# Patient Record
Sex: Female | Born: 1960 | Race: White | Hispanic: No | Marital: Married | Smoking: Never smoker
Health system: Southern US, Community
[De-identification: ages and names within clinical notes are randomized; demographics above are authoritative.]

## PROBLEM LIST (undated history)

## (undated) DIAGNOSIS — G43909 Migraine, unspecified, not intractable, without status migrainosus: Secondary | ICD-10-CM

## (undated) DIAGNOSIS — L409 Psoriasis, unspecified: Secondary | ICD-10-CM

## (undated) DIAGNOSIS — M1990 Unspecified osteoarthritis, unspecified site: Secondary | ICD-10-CM

## (undated) DIAGNOSIS — L4050 Arthropathic psoriasis, unspecified: Secondary | ICD-10-CM

## (undated) HISTORY — DX: Arthropathic psoriasis, unspecified: L40.50

## (undated) HISTORY — DX: Migraine, unspecified, not intractable, without status migrainosus: G43.909

## (undated) HISTORY — DX: Unspecified osteoarthritis, unspecified site: M19.90

## (undated) HISTORY — DX: Psoriasis, unspecified: L40.9

---

## 1999-05-02 ENCOUNTER — Other Ambulatory Visit: Admission: RE | Admit: 1999-05-02 | Discharge: 1999-05-02 | Payer: Self-pay | Admitting: Obstetrics and Gynecology

## 2000-06-03 ENCOUNTER — Other Ambulatory Visit: Admission: RE | Admit: 2000-06-03 | Discharge: 2000-06-03 | Payer: Self-pay | Admitting: Obstetrics and Gynecology

## 2000-06-18 ENCOUNTER — Encounter: Payer: Self-pay | Admitting: Gastroenterology

## 2000-06-18 ENCOUNTER — Ambulatory Visit (HOSPITAL_COMMUNITY): Admission: RE | Admit: 2000-06-18 | Discharge: 2000-06-18 | Payer: Self-pay | Admitting: Gastroenterology

## 2001-08-27 ENCOUNTER — Ambulatory Visit (HOSPITAL_COMMUNITY): Admission: RE | Admit: 2001-08-27 | Discharge: 2001-08-27 | Payer: Self-pay | Admitting: Gastroenterology

## 2003-05-27 ENCOUNTER — Encounter: Admission: RE | Admit: 2003-05-27 | Discharge: 2003-05-27 | Payer: Self-pay | Admitting: Rheumatology

## 2003-12-07 ENCOUNTER — Other Ambulatory Visit: Admission: RE | Admit: 2003-12-07 | Discharge: 2003-12-07 | Payer: Self-pay | Admitting: Obstetrics and Gynecology

## 2003-12-14 ENCOUNTER — Ambulatory Visit (HOSPITAL_COMMUNITY): Admission: RE | Admit: 2003-12-14 | Discharge: 2003-12-14 | Payer: Self-pay | Admitting: Obstetrics and Gynecology

## 2004-10-30 ENCOUNTER — Observation Stay (HOSPITAL_COMMUNITY): Admission: EM | Admit: 2004-10-30 | Discharge: 2004-10-31 | Payer: Self-pay | Admitting: Emergency Medicine

## 2004-12-17 ENCOUNTER — Other Ambulatory Visit: Admission: RE | Admit: 2004-12-17 | Discharge: 2004-12-17 | Payer: Self-pay | Admitting: Obstetrics and Gynecology

## 2004-12-24 ENCOUNTER — Ambulatory Visit (HOSPITAL_COMMUNITY): Admission: RE | Admit: 2004-12-24 | Discharge: 2004-12-24 | Payer: Self-pay | Admitting: Obstetrics and Gynecology

## 2004-12-28 ENCOUNTER — Ambulatory Visit (HOSPITAL_COMMUNITY): Admission: RE | Admit: 2004-12-28 | Discharge: 2004-12-28 | Payer: Self-pay | Admitting: Gastroenterology

## 2005-04-10 ENCOUNTER — Encounter: Admission: RE | Admit: 2005-04-10 | Discharge: 2005-04-10 | Payer: Self-pay | Admitting: General Surgery

## 2005-05-03 ENCOUNTER — Ambulatory Visit (HOSPITAL_COMMUNITY): Admission: RE | Admit: 2005-05-03 | Discharge: 2005-05-05 | Payer: Self-pay | Admitting: General Surgery

## 2005-05-16 ENCOUNTER — Ambulatory Visit (HOSPITAL_BASED_OUTPATIENT_CLINIC_OR_DEPARTMENT_OTHER): Admission: RE | Admit: 2005-05-16 | Discharge: 2005-05-16 | Payer: Self-pay | Admitting: General Surgery

## 2005-05-23 ENCOUNTER — Ambulatory Visit (HOSPITAL_BASED_OUTPATIENT_CLINIC_OR_DEPARTMENT_OTHER): Admission: RE | Admit: 2005-05-23 | Discharge: 2005-05-23 | Payer: Self-pay | Admitting: General Surgery

## 2005-12-26 ENCOUNTER — Other Ambulatory Visit: Admission: RE | Admit: 2005-12-26 | Discharge: 2005-12-26 | Payer: Self-pay | Admitting: Obstetrics and Gynecology

## 2006-11-06 ENCOUNTER — Ambulatory Visit (HOSPITAL_COMMUNITY): Admission: RE | Admit: 2006-11-06 | Discharge: 2006-11-06 | Payer: Self-pay | Admitting: Obstetrics and Gynecology

## 2006-11-10 ENCOUNTER — Encounter: Admission: RE | Admit: 2006-11-10 | Discharge: 2006-11-10 | Payer: Self-pay | Admitting: Obstetrics and Gynecology

## 2006-12-29 ENCOUNTER — Other Ambulatory Visit: Admission: RE | Admit: 2006-12-29 | Discharge: 2006-12-29 | Payer: Self-pay | Admitting: Obstetrics and Gynecology

## 2007-11-20 ENCOUNTER — Ambulatory Visit (HOSPITAL_COMMUNITY): Admission: RE | Admit: 2007-11-20 | Discharge: 2007-11-20 | Payer: Self-pay | Admitting: Obstetrics and Gynecology

## 2008-02-09 ENCOUNTER — Other Ambulatory Visit: Admission: RE | Admit: 2008-02-09 | Discharge: 2008-02-09 | Payer: Self-pay | Admitting: Obstetrics and Gynecology

## 2008-12-30 ENCOUNTER — Ambulatory Visit (HOSPITAL_COMMUNITY): Admission: RE | Admit: 2008-12-30 | Discharge: 2008-12-30 | Payer: Self-pay | Admitting: Obstetrics and Gynecology

## 2009-02-14 ENCOUNTER — Other Ambulatory Visit: Admission: RE | Admit: 2009-02-14 | Discharge: 2009-02-14 | Payer: Self-pay | Admitting: Obstetrics and Gynecology

## 2010-02-27 ENCOUNTER — Other Ambulatory Visit: Admission: RE | Admit: 2010-02-27 | Discharge: 2010-02-27 | Payer: Self-pay | Admitting: Obstetrics and Gynecology

## 2010-06-03 ENCOUNTER — Encounter: Payer: Self-pay | Admitting: Obstetrics and Gynecology

## 2010-09-28 NOTE — Op Note (Signed)
NAMEFEROL, [name]            ACCOUNT NO.:  [id number]   MEDICAL RECORD NO.:  [id number]          PATIENT TYPE:  AMB   LOCATION:  DSC                          FACILITY:  [facility]   PHYSICIAN:  Cherylynn Ridges, M.D.    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   PROCEDURE:  Exam under anesthesia with seton replacement, umbilical tape.   SURGEON:  Jimmye Norman, M.D.   ANESTHESIA:  General endotracheal anesthesia.   ESTIMATED BLOOD LOSS:  Less than 20 mL.   COMPLICATIONS:  None.   CONDITION:  Stable.   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  The band width of the remaining sphincteric muscle was  approximately 8 mm and I did not feel comfortable doing a sphincterotomy  without causing the patient to have significant incontinence.  However, the  fistulous tract is large and probably allows for the passage of significant  amount of stool soilage.   INDICATIONS FOR PROCEDURE:  The patient is a 50 year old who had a  significant perirectal abscess with a previous supralevator abscess drained  internally and now with a large fistulous tract who has a seton in place who  comes in for surgery to examine while under anesthesia and, also, to replace  the seton or to perform sphincterotomy.   PROCEDURE IN DETAIL:  The patient was taken to the operating room and  initially placed on the table in the supine position.  After an adequate  endotracheal anesthesia was administered, she was placed in lithotomy and  then prepped and draped in the usual sterile manner.  Using a large bullet  type anal speculum, we inspected the area of the fistulous tract which was  in the left lateral posterior position.  We were able to easily pass a  clamp  around this fistulous tract and we subsequently cut the previously placed  umbilical seton.  We did not cut across the muscle, itself, however, you  could almost pass a surgeons finger around the muscle, itself.  We irrigated  with about 300 mL of saline prior to placing the umbilical tape seton around  the sphincteric muscle at that point.  Again, it constricted tightly around  the muscle but it did not cut through.  We suspect that over time it will  continue to pull through and, perhaps, at the next operation complete  sphincterotomy could be done.  However, at the current pace, I did not want  to cause the patient to have incontinence with sphincterotomy.  We could  feel muscle throughout the other part of  the anorectal junction, however, because of the length and the thickness of  this band of sphincteric muscle, I did not feel comfortable cutting it.  I  did not feel any other areas of abscess formation or fluid collection and  the  only problem is the fistulous tract.      Cherylynn Ridges, M.D.  Electronically Signed     JOW/MEDQ  D:  05/23/2005  T:  05/23/2005  Job:  [id number]

## 2010-09-28 NOTE — Discharge Summary (Signed)
[name], [name]            ACCOUNT NO.:  [id number]   MEDICAL RECORD NO.:  [id number]          PATIENT TYPE:  OBV   LOCATION:  5703                         FACILITY:  [facility]   PHYSICIAN:  Cherylynn Ridges, M.D.    DATE OF BIRTH:  06/21/1960   DATE OF ADMISSION:  10/30/2004  DATE OF DISCHARGE:  10/31/2004                                 DISCHARGE SUMMARY   DISCHARGE DIAGNOSES:  1.  Perirectal abscess, status post incision and drainage on October 30, 2004,      under the care of Cherylynn Ridges, M.D.  2.  Penicillin abuse.   HISTORY OF PRESENT ILLNESS:  [name] is a 50 year old female with no  significant past medical history, who complained of a 10-day history of  rectal pain.  She initially saw Anselmo Rod, M.D., and was initially  thought to have a rectal fissure.  On exam, pus was extracted from her  rectum and she was sent to the emergency room for surgical evaluation.   HOSPITAL COURSE:  The patient was evaluated by Cherylynn Ridges, M.D., and was  found to have a perirectal abscess.  He felt that the patient could be  better examined under anesthesia.  She was taken to the operating room and  found to have a perirectal abscess that extended intrarectally.  She did  have an incision and drainage of this area.  She remained in the hospital  overnight.  The following day, she felt  much better and was ready for  discharge to home.  She was afebrile at that time.  She was discharged home  on Cipro and Flagyl.  She is to follow up with Dr. Lindie Spruce in 10 days.   DISCHARGE MEDICATIONS:  She has been given a prescription for Vicodin for  pain, Phenergan for nausea, Cipro for 10 days and Flagyl for 10 days.   FOLLOWUP:  The patient is to follow up with Dr. Lindie Spruce in approximately 10  days.  She is to return to the emergency room in two days to remove the  packing.  She is to call for increase in pain or fever greater than 100.5  degrees.       LB/MEDQ  D:  11/19/2004  T:   11/19/2004  Job:  [id number]   cc:   Anselmo Rod, M.D.  [address]  [address]  [place]  [place] [address]  Fax: [phone]

## 2010-09-28 NOTE — H&P (Signed)
[name], [name]            ACCOUNT NO.:  [id number]   MEDICAL RECORD NO.:  [id number]          PATIENT TYPE:  EMS   LOCATION:  MAJO                         FACILITY:  [facility]   PHYSICIAN:  Cherylynn Ridges, M.D.    DATE OF BIRTH:  1960/11/11   DATE OF ADMISSION:  10/30/2004  DATE OF DISCHARGE:                                HISTORY & PHYSICAL   [name] is a 50 year old female with no known significant past medical  history who complains of 10 days of rectal pain.  She saw Dr. Loreta Ave today and  was initially thought to have a rectal fissure then pus was extracted from  her rectum.  She was then sent to the emergency room for surgical  evaluation.   PAST MEDICAL HISTORY:  No significant.   PAST SURGICAL HISTORY:  None.   SOCIAL HISTORY:  No tobacco, alcohol or illicit drug use.   FAMILY HISTORY:  Parents alive and well.   ALLERGIES:  PENICILLIN.   MEDICATIONS:  She does take birth control pills.   Temperature 98.6, respirations 20, blood pressure 116/80, heart rate 115, O2  saturations 98% on room air.  HEENT:  Grossly normal, no bruits, no JVD or thyromegaly.  Nares without  discharge.  Sclera clear.  Conjunctiva normal.  CHEST:  Clear to auscultation bilaterally, no wheezing or rhonchi.  HEART:  Regular rate and rhythm.  No murmur, rub or ectopy.  ABDOMEN:  Soft, nontender, nondistended.  Good bowel sounds, nontender.  No  hepatosplenomegaly.  She has an internal rectal fissure at the 9 o'clock  lithotomy position.  She does have some induration on the left side at the  lithotomy position.  NEURO:  Cranial nerves II-XII are grossly intact.  SKIN:  Warm and dry.   ASSESSMENT AND PLAN:  Rectal abscess with a probable spontaneous drain.  The  patient was seen and examined by Dr. Lindie Spruce and he feels that the patient  will need more thorough exam under anesthesia.  The risks and benefits have  been explained by Dr. Lindie Spruce to this patient and she agrees to proceed to the  operating room.      Guy Franco, P.A.      Cherylynn Ridges, M.D.  Electronically Signed    LB/MEDQ  D:  10/30/2004  T:  10/30/2004  Job:  [id number]   cc:   Anselmo Rod, M.D.  [address]  [address]  [place]  [place] [address]  Fax: [phone]   Cherylynn Ridges, M.D.  [address]  [place]  [place] [address]

## 2010-09-28 NOTE — Op Note (Signed)
[name], [name]            ACCOUNT NO.:  [id number]   MEDICAL RECORD NO.:  [id number]          PATIENT TYPE:  OIB   LOCATION:  5704                         FACILITY:  [facility]   PHYSICIAN:  Cherylynn Ridges, M.D.    DATE OF BIRTH:  1960-09-03   DATE OF PROCEDURE:  05/03/2005  DATE OF DISCHARGE:  05/05/2005                                 OPERATIVE REPORT   PREOP DIAGNOSIS:  A large perirectal abscess of the left area.   POSTOP DIAGNOSIS:  A large perirectal abscess of the left area with fistula  in ano.   PROCEDURE:  1.  Incision and drainage of a large left medial perirectal abscess.  2.  Seaton placement and fistula in ano.   SURGEON:  Cherylynn Ridges, M.D.   ANESTHESIA:  General endotracheal.   ESTIMATED BLOOD LOSS:  Less than 30 mL.   COMPLICATIONS:  None.   CONDITION:  Stable.   INDICATIONS FOR OPERATION:  The patient is a 50 year old, who had a previous  supralevator, perirectal abscess drained transrectally; who now comes in  with a perirectal abscess pointed at the skin.   FINDINGS:  The patient had about of 50 mL perirectal abscess in the left  perirectal area. There was an obvious tract and sinus cavity extending into  an opening going into the rectum just above the dentate line.   OPERATION:  The patient was taken to operating room, placed on the table in  the supine position. After an adequate amount of IV sedation was given, she  was intubated and then prepped and draped in the usual sterile manner in the  lithotomy position.   We palpated digitally into the rectal canal and came out with a large amount  of pus which came from an opening in the left posterior aspect of the rectal  area. It was just above the dentate line; and you could actually feel the  indentation into the fistulous sac area. We made an incision on the skin  outside of the dentate line, and opened up into a cavity which drained out a  large amount of sort of light brownish pus. Aerobic  and anaerobic cultures  were sent of this fluid. We opened up the pocket, bluntly, using hemostat  clamps and also blunt dissection with the surgeon's finger; and we were able  to use a right-angle clamp to go directly through the sinus cavity into the  fistulous tract. We pulled an umbilical tape down through this tract and  then tied it out tightly around the skin, creating a Seaton which we placed  hemoclips on to help keep it tight. We cut it to the appropriate length,  then subsequently packed the abscess cavity with 1-inch Iodoform Nu Gauze,  about a half a bottle of this was used.   Once we had packed the wound, we irrigated with saline and then covered it  with a sterile dressing. Her Burnadette Pop will  have to be readjusted on a regular  basis approximately every 1-2 weeks and this will be done as an outpatient.  Her packing will be removed and 24-48  hours.      Cherylynn Ridges, M.D.  Electronically Signed     JOW/MEDQ  D:  05/03/2005  T:  05/06/2005  Job:  [id number]

## 2010-09-28 NOTE — Op Note (Signed)
NAMEALEXX, GIAMBRA            ACCOUNT NO.:  [id number]   MEDICAL RECORD NO.:  [id number]          PATIENT TYPE:  OBV   LOCATION:  5703                         FACILITY:  [facility]   PHYSICIAN:  Janetta Hora. Fields, MD  DATE OF BIRTH:  March 06, 1961   DATE OF PROCEDURE:  12/13/2004  DATE OF DISCHARGE:  10/31/2004                                 OPERATIVE REPORT   PROCEDURE:  Repair of left posterior tibial artery.   PREOPERATIVE DIAGNOSIS:  Laceration left posterior tibial artery.   POSTOPERATIVE DIAGNOSIS:  Laceration left posterior tibial artery.   ANESTHESIA:  General.   INDICATIONS:  The patient is a 50 year old female who sustained a laceration  to the left leg, just posterior to the medial malleolus. The patient had  pulsatile bleeding from the ankle on arrival in the emergency department.   OPERATIVE FINDINGS:  1.  Transected posterior tibial artery.  2.  Primary repair of posterior tibial artery.   OPERATIVE DETAIL:  After obtaining informed consent, the patient was taken  to the operating room. The patient was placed in supine position on the  operating table. Next, the patient's entire left lower extremity was prepped  and draped in the usual sterile fashion. The laceration was explored and  some coagulated blood was removed. There was then pulsatile bleeding from  the proximal and distal posterior tibial artery. These were both controlled  with fine bulldog clamps. Both ends of the artery were thoroughly irrigated  with heparinized saline. The artery was proximally 1.5 mm in diameter.   Next, the artery was repaired using interrupted 7-0 Prolene sutures. Clamps  were then removed, after hemostasis was obtained. The wound was thoroughly  irrigated with a liter of normal saline solution. Skin edges were then  reapproximated using interrupted nylon sutures. The patient tolerated the  procedure well. There were no complications. Instruments, sponge and needle  counts were  correct at the end of the case. The patient had a palpable  posterior tibial pulse with biphasic posterior tibial and dorsalis pedis  Doppler signals at the end of the case. The patient was taken to the  recovery room in stable condition.       CEF/MEDQ  D:  12/13/2004  T:  12/14/2004  Job:  [id number]

## 2010-09-28 NOTE — Op Note (Signed)
[name], [name]            ACCOUNT NO.:  [id number]   MEDICAL RECORD NO.:  [id number]          PATIENT TYPE:  INP   LOCATION:  2550                         FACILITY:  [facility]   PHYSICIAN:  Cherylynn Ridges, M.D.    DATE OF BIRTH:  12/12/1960   DATE OF PROCEDURE:  10/30/2004  DATE OF DISCHARGE:                                 OPERATIVE REPORT   PREOPERATIVE DIAGNOSIS:  Perirectal abscess.   POSTOPERATIVE DIAGNOSIS:  Large left supralevator perirectal abscess.   PROCEDURE:  1.  Examination of the anorectal area under anesthesia.  2.  Incision and drainage intramucosally of large left perirectal      supralevator abscess.   SURGEON:  Jimmye Norman, M.D.   ANESTHESIA:  General endotracheal.   ESTIMATED BLOOD LOSS:  Less than 30 mL.   COMPLICATIONS:  None.   CONDITION:  Stable.   INDICATION FOR OPERATION:  The patient is a 50 year old with severe  perirectal pain and what could be palpated as a fissure in the left lateral  aspect of the rectum who now comes in for exam under anesthesia and possible  drainage of abscess.   FINDINGS:  The patient had a large, probably 100 mL, left supralevator  perirectal abscess.  It drained under pressure and was drained  intramucosally or intrarectally.   OPERATION:  The patient was taken to the operating room, placed on the table  in the supine position.  After an adequate endotracheal anesthetic was  administered, she was placed in stirrups in the lithotomy position and then  prepped and draped in the usual sterile manner, exposing the anal area.   Using the anal speculum, the area which had been previously noted to be  excoriated in the left lateral aspect of the perirectal area, approximately  3-4 o'clock in position, with 12 o'clock being directly anterior, was  inspected.  There was just some excoriating material, however upon  pressuring that area pus would come up through the muscles.  We used a Kelly  clamp to bluntly dissect  into a large abscess cavity which was sort of in  the posterolateral aspect on the left side.  We opened up this cavity in  order to allow for the surgeon's fingers to get into the cavity and break up  loculations.  A 100 mL cavity was noted.  We subsequently irrigated with  saline solution and then packed it with an entire bottle of 0.5 inch  Iodoform Nu-Gauze.  This provided adequate hemostasis and a sterile dressing  was applied.  All counts were correct.       JOW/MEDQ  D:  10/30/2004  T:  10/30/2004  Job:  [id number]   cc:   Anselmo Rod, M.D.  [address]  [address]  [place]  [place] [address]  Fax: [phone]

## 2010-09-28 NOTE — Op Note (Signed)
[name], [name]            ACCOUNT NO.:  [id number]   MEDICAL RECORD NO.:  [id number]          PATIENT TYPE:  AMB   LOCATION:  DSC                          FACILITY:  [facility]   PHYSICIAN:  Cherylynn Ridges, M.D.    DATE OF BIRTH:  [birth year]   DATE OF PROCEDURE:  05/16/2005  DATE OF DISCHARGE:                                 OPERATIVE REPORT   PREOP DIAGNOSIS:  Fistula in ano with seton in place.   POSTOP DIAGNOSIS:  Fistula in ano with seton in place.   PROCEDURE:  1.  Examination under anesthesia.  2.  Replacement of seton.   SURGEON:  Dr. Lindie Spruce.   ANESTHESIA:  General with a laryngeal airway.   ESTIMATED BLOOD LOSS:  Less than 20 mL.   COMPLICATIONS:  None.   CONDITION:  Stable.   FINDINGS:  The patient has a left both posterior fistula in ano with width  of approximately 4 cm. It goes around the external sphincter and the  previously placed umbilical tape seton had partially pulled through the skin  and subcutaneous tissue and muscle.   OPERATION:  The patient was taken to the operating room, placed on table in  supine position. After an adequate general laryngeal airway anesthetic was  administered, she was placed in lithotomy and prepped and draped in usual  sterile manner.   We used an anal speculum in order to inspect the area where the fistula came  into the left side with the seton in place. We removed the previous seton  and then the explored the external perianal wound using a Kelly clamp. We  able to pass the Kelly clamp through the fistulous tract up into the  anorectal opening and subsequently passed an umbilical tape around the  fistulous tract again. Prior to doing so, we had scraped out and more or  less curetted the inside of the fistulous tract using a clamp. Once the  umbilical tape was in place, we tied it down through the previously cut  through subcutaneous tissue tightly and secured it in place with three  medium clips. This left the  seton  in place. We irrigated the tract with saline solution. There was  minimal pus; however, you could see where stool had fallen through the  tract. We irrigated with copious amounts of saline.  Then we injected 10 mL  of half percent Marcaine without epinephrine into the wound. All counts were  correct.      Cherylynn Ridges, M.D.  Electronically Signed     JOW/MEDQ  D:  05/16/2005  T:  05/16/2005  Job:  [id number]

## 2010-09-28 NOTE — Procedures (Signed)
[place]  Patient:    [name], [name] Visit Number: [id number] MRN: [id number]          Service Type: END Location: ENDO Attending Physician:  Charna Elizabeth Dictated by:   Anselmo Rod, M.D. Proc. Date: 08/27/01 Admit Date:  08/27/2001                             Procedure Report  DATE OF BIRTH:  12-02-1960  REFERRING PHYSICIAN:  PROCEDURE PERFORMED:  Colonoscopy.  ENDOSCOPIST:  Anselmo Rod, M.D.  INSTRUMENT USED:  Olympus video colonoscope.  INDICATIONS FOR PROCEDURE:  History of adenomatous polyp in a 50 year old white female that was removed five years ago, repeat colonoscopy is being done to rule out recurrent polyps.  PREPROCEDURE PREPARATION:  Informed consent was procured from the patient. The patient was fasted for eight hours prior to the procedure and prepped with a bottle of magnesium citrate and a gallon of NuLytely the night prior to the procedure.  PREPROCEDURE PHYSICAL:  The patient had stable vital signs.  Neck supple. Chest clear to auscultation.  S1, S2 regular.  Abdomen soft with normal bowel sounds.  DESCRIPTION OF PROCEDURE:  The patient was placed in the left lateral decubitus position and sedated with 80 mg of Demerol and 8 mg of Versed intravenously.  Once the patient was adequately sedated and maintained on low-flow oxygen and continuous cardiac monitoring, the Olympus video colonoscope was advanced from the rectum to the cecum without difficulty.  The appendiceal orifice and ileocecal valve were clearly visualized and photographed.  No masses, polyps, erosions, ulcerations or diverticula were seen.  There was a prominent hypertrophied anal papilla seen on retroflexion. The rest of the colonic mucosa appeared healthy and without lesions.  IMPRESSION:  Normal colonoscopy up to the terminal ileum except for a prominent anal hypertrophic papilla seen on retroflexion in the  rectum.  RECOMMENDATIONS: 1. Repeat colorectal cancer screening has been recommended in the next five    years considering her previous history of adenomatous polyps. 2. Outpatient follow-up on a p.r.n. basis. 3. Continue a high fiber diet.Dictated by:   Anselmo Rod, M.D.  Attending Physician:  Charna Elizabeth DD:  08/27/01 TD:  08/27/01 Job: [id number]

## 2010-09-28 NOTE — Op Note (Signed)
[name], [name]            ACCOUNT NO.:  [id number]   MEDICAL RECORD NO.:  [id number]          PATIENT TYPE:  AMB   LOCATION:  ENDO                         FACILITY:  [facility]   PHYSICIAN:  Anselmo Rod, M.D.  DATE OF BIRTH:  [birth year]   DATE OF PROCEDURE:  12/28/2004  DATE OF DISCHARGE:                                 OPERATIVE REPORT   PROCEDURE:  Screening colonoscopy.   ENDOSCOPIST:  Charna Elizabeth, M.D.   INSTRUMENT USED:  Olympus video colonoscope.   INDICATIONS FOR PROCEDURE:  50 year old white female with a history of an  adenomatous polyp removed in the past undergoing surveillance colonoscopy to  rule out recurrent polyps.  The patient has had a perirectal abscess  requiring drainage in the past, as well.  Rule out IBD.   PREPROCEDURE PREPARATION:  Informed consent was obtained from the patient.  The patient was fasted for eight hours prior to the procedure and prepped  with a bottle of magnesium citrate and a gallon of GoLYTELY the night prior  to the procedure.  The risks and benefits of the procedure including a 10%  missed rate of cancer and polyps were discussed with her, as well.   PREPROCEDURE PHYSICAL:  Patient with stable vital signs.  Neck supple.  Chest clear to auscultation.  S1 and S2 regular.  Abdomen soft with normal  bowel sounds.   DESCRIPTION OF PROCEDURE:  The patient was placed in the left lateral  decubitus position, sedated with 70 mg of Demerol and 7.5 mg Versed in slow  incremental doses.  Once the patient was adequately sedated, maintained on  low flow oxygen and continuous cardiac monitoring, the Olympus video  colonoscope was advanced from the rectum to the cecum.  There was some stool  in the right side of the colon.  Multiple washings were done.  No masses,  polyps, erosions, ulcerations, or diverticula were seen.  Retroflexion in  the rectum revealed small internal hemorrhoids.  A fistulous opening was  recognized in the rectum close  to the anal verge.  No perirectal  abnormalities were noted.  The patient tolerated the procedure well without  complications.  The terminal ileum appeared healthy without lesions.   IMPRESSION:  Normal colonoscopy to the terminal ileum except for small  fistulous opening seen close to the anal verge and small internal  hemorrhoids seen on retroflexion.   RECOMMENDATIONS:  1.  A repeat colonoscopy has been recommended at age 12, further      recommendations will be made as needed.  2.  Outpatient follow up as need arises in the future.      Anselmo Rod, M.D.  Electronically Signed     JNM/MEDQ  D:  12/28/2004  T:  12/28/2004  Job:  [id number]   cc:   Teena Irani. Arlyce Dice, M.D.  [address]  [place]  [place] [address]  Fax: [phone]   Cherylynn Ridges, M.D.  [address], [address]  [place]  [place] [address]

## 2010-10-30 ENCOUNTER — Other Ambulatory Visit (HOSPITAL_COMMUNITY): Payer: Self-pay | Admitting: Nurse Practitioner

## 2010-10-30 DIAGNOSIS — Z1231 Encounter for screening mammogram for malignant neoplasm of breast: Secondary | ICD-10-CM

## 2010-11-07 ENCOUNTER — Ambulatory Visit (HOSPITAL_COMMUNITY)
Admission: RE | Admit: 2010-11-07 | Discharge: 2010-11-07 | Disposition: A | Discharge status: Home / Self Care | Payer: BC Managed Care – PPO | Source: Ambulatory Visit | Attending: Nurse Practitioner | Admitting: Nurse Practitioner

## 2010-11-07 DIAGNOSIS — Z1231 Encounter for screening mammogram for malignant neoplasm of breast: Secondary | ICD-10-CM | POA: Insufficient documentation

## 2011-05-09 ENCOUNTER — Other Ambulatory Visit: Payer: Self-pay | Admitting: Nurse Practitioner

## 2011-05-09 ENCOUNTER — Other Ambulatory Visit (HOSPITAL_COMMUNITY)
Admission: RE | Admit: 2011-05-09 | Discharge: 2011-05-09 | Disposition: A | Discharge status: Home / Self Care | Payer: BC Managed Care – PPO | Source: Ambulatory Visit | Attending: Obstetrics and Gynecology | Admitting: Obstetrics and Gynecology

## 2011-05-09 DIAGNOSIS — Z01419 Encounter for gynecological examination (general) (routine) without abnormal findings: Secondary | ICD-10-CM | POA: Insufficient documentation

## 2011-12-02 ENCOUNTER — Other Ambulatory Visit (HOSPITAL_COMMUNITY): Payer: Self-pay | Admitting: Nurse Practitioner

## 2011-12-02 DIAGNOSIS — Z1231 Encounter for screening mammogram for malignant neoplasm of breast: Secondary | ICD-10-CM

## 2011-12-19 ENCOUNTER — Ambulatory Visit (HOSPITAL_COMMUNITY): Payer: BC Managed Care – PPO

## 2012-01-17 ENCOUNTER — Ambulatory Visit (HOSPITAL_COMMUNITY)
Admission: RE | Admit: 2012-01-17 | Discharge: 2012-01-17 | Disposition: A | Discharge status: Home / Self Care | Payer: BC Managed Care – PPO | Source: Ambulatory Visit | Attending: Nurse Practitioner | Admitting: Nurse Practitioner

## 2012-01-17 DIAGNOSIS — Z1231 Encounter for screening mammogram for malignant neoplasm of breast: Secondary | ICD-10-CM | POA: Insufficient documentation

## 2013-07-02 ENCOUNTER — Other Ambulatory Visit (HOSPITAL_COMMUNITY): Payer: Self-pay | Admitting: Nurse Practitioner

## 2013-07-02 DIAGNOSIS — Z1231 Encounter for screening mammogram for malignant neoplasm of breast: Secondary | ICD-10-CM

## 2013-07-08 ENCOUNTER — Ambulatory Visit (HOSPITAL_COMMUNITY): Payer: BC Managed Care – PPO

## 2013-07-27 ENCOUNTER — Ambulatory Visit (HOSPITAL_COMMUNITY)
Admission: RE | Admit: 2013-07-27 | Discharge: 2013-07-27 | Disposition: A | Discharge status: Home / Self Care | Payer: BC Managed Care – PPO | Source: Ambulatory Visit | Attending: Nurse Practitioner | Admitting: Nurse Practitioner

## 2013-07-27 DIAGNOSIS — Z1231 Encounter for screening mammogram for malignant neoplasm of breast: Secondary | ICD-10-CM | POA: Insufficient documentation

## 2014-08-18 ENCOUNTER — Other Ambulatory Visit: Payer: Self-pay | Admitting: Nurse Practitioner

## 2014-08-18 ENCOUNTER — Other Ambulatory Visit (HOSPITAL_COMMUNITY)
Admission: RE | Admit: 2014-08-18 | Discharge: 2014-08-18 | Disposition: A | Discharge status: Home / Self Care | Payer: BC Managed Care – PPO | Source: Ambulatory Visit | Attending: Nurse Practitioner | Admitting: Nurse Practitioner

## 2014-08-18 DIAGNOSIS — Z01419 Encounter for gynecological examination (general) (routine) without abnormal findings: Secondary | ICD-10-CM | POA: Insufficient documentation

## 2014-08-18 DIAGNOSIS — Z1151 Encounter for screening for human papillomavirus (HPV): Secondary | ICD-10-CM | POA: Diagnosis present

## 2014-08-23 LAB — CYTOLOGY - PAP

## 2014-11-03 ENCOUNTER — Other Ambulatory Visit (HOSPITAL_COMMUNITY): Payer: Self-pay | Admitting: Nurse Practitioner

## 2014-11-03 DIAGNOSIS — Z1231 Encounter for screening mammogram for malignant neoplasm of breast: Secondary | ICD-10-CM

## 2014-11-08 ENCOUNTER — Ambulatory Visit (HOSPITAL_COMMUNITY)
Admission: RE | Admit: 2014-11-08 | Discharge: 2014-11-08 | Disposition: A | Discharge status: Home / Self Care | Payer: BC Managed Care – PPO | Source: Ambulatory Visit | Attending: Nurse Practitioner | Admitting: Nurse Practitioner

## 2014-11-08 ENCOUNTER — Other Ambulatory Visit (HOSPITAL_COMMUNITY): Payer: Self-pay | Admitting: Nurse Practitioner

## 2014-11-08 DIAGNOSIS — Z1231 Encounter for screening mammogram for malignant neoplasm of breast: Secondary | ICD-10-CM

## 2016-02-29 ENCOUNTER — Ambulatory Visit: Payer: Self-pay | Admitting: Rheumatology

## 2016-04-01 ENCOUNTER — Encounter: Payer: Self-pay | Admitting: Rheumatology

## 2016-04-01 ENCOUNTER — Ambulatory Visit (INDEPENDENT_AMBULATORY_CARE_PROVIDER_SITE_OTHER): Payer: BC Managed Care – PPO | Admitting: Rheumatology

## 2016-04-01 VITALS — BP 120/66 | HR 61 | Resp 14 | Ht 63.0 in | Wt 190.0 lb

## 2016-04-01 DIAGNOSIS — L408 Other psoriasis: Secondary | ICD-10-CM

## 2016-04-01 DIAGNOSIS — E669 Obesity, unspecified: Secondary | ICD-10-CM

## 2016-04-01 DIAGNOSIS — Z79899 Other long term (current) drug therapy: Secondary | ICD-10-CM

## 2016-04-01 DIAGNOSIS — L4050 Arthropathic psoriasis, unspecified: Secondary | ICD-10-CM | POA: Diagnosis not present

## 2016-04-01 LAB — COMPLETE METABOLIC PANEL WITH GFR
ALT: 13 U/L (ref 6–29)
AST: 23 U/L (ref 10–35)
Albumin: 3.9 g/dL (ref 3.6–5.1)
Alkaline Phosphatase: 87 U/L (ref 33–130)
BUN: 10 mg/dL (ref 7–25)
CO2: 27 mmol/L (ref 20–31)
Calcium: 9.5 mg/dL (ref 8.6–10.4)
Chloride: 105 mmol/L (ref 98–110)
Creat: 0.63 mg/dL (ref 0.50–1.05)
GFR, Est African American: >89 mL/min (ref >=60)
GFR, Est Non African American: >89 mL/min (ref >=60)
Glucose, Bld: 97 mg/dL (ref 65–99)
Potassium: 4.7 mmol/L (ref 3.5–5.3)
Sodium: 140 mmol/L (ref 135–146)
Total Bilirubin: 0.8 mg/dL (ref 0.2–1.2)
Total Protein: 6.8 g/dL (ref 6.1–8.1)

## 2016-04-01 LAB — CBC WITH DIFFERENTIAL/PLATELET
Basophils Absolute: 82 cells/uL (ref 0–200)
Basophils Relative: 1 %
Eosinophils Absolute: 164 cells/uL (ref 15–500)
Eosinophils Relative: 2 %
HCT: 41.7 % (ref 35.0–45.0)
Hemoglobin: 14 g/dL (ref 11.7–15.5)
Lymphocytes Relative: 41 %
Lymphs Abs: 3362 cells/uL (ref 850–3900)
MCH: 31.5 pg (ref 27.0–33.0)
MCHC: 33.6 g/dL (ref 32.0–36.0)
MCV: 93.7 fL (ref 80.0–100.0)
MPV: 10 fL (ref 7.5–12.5)
Monocytes Absolute: 738 cells/uL (ref 200–950)
Monocytes Relative: 9 %
Neutro Abs: 3854 cells/uL (ref 1500–7800)
Neutrophils Relative %: 47 %
Platelets: 311 10*3/uL (ref 140–400)
RBC: 4.45 MIL/uL (ref 3.80–5.10)
RDW: 15.2 % — ABNORMAL HIGH (ref 11.0–15.0)
WBC: 8.2 10*3/uL (ref 3.8–10.8)

## 2016-04-01 MED ORDER — FOLIC ACID 1 MG PO TABS: 2.0000 mg | ORAL_TABLET | Freq: Every day | ORAL | 4 refills | Status: DC

## 2016-04-01 MED ORDER — METHOTREXATE 2.5 MG PO TABS: 12.5000 mg | ORAL_TABLET | ORAL | 0 refills | Status: DC

## 2016-04-01 NOTE — Progress Notes (Signed)
Office Visit Note  Patient: [name]             Date of Birth: July 11, 1960           MRN: [id number]             PCP: No PCP Per Patient Referring: No ref. provider found Visit Date: 04/01/2016 Occupation: @GUAROCC @    Subjective:  No chief complaint on file. Follow-up on [place] arthritis and psoriasis  History of Present Illness: [name] is a 55 y.o. female last seen in our office on 09/28/2015.  Patient is doing very well with assorted arthritis and psoriasis. She is taking 5 pills of methotrexate per week since approximately September 2017. She tapered down to 5 from 6 pills per week since she was doing so well as of September 2017. She tried 6 pills per week starting in May 2017 and did really well.  She's taking folic acid 2 mg every day.  No other complaints. Doing well overall.   Activities of Daily Living:  Patient reports morning stiffness for 15 minutes.   Patient denies nocturnal pain.  Difficulty dressing/grooming: Denies Difficulty climbing stairs: Denies Difficulty getting out of chair: Denies Difficulty using hands for taps, buttons, cutlery, and/or writing: Denies   Review of Systems  Constitutional: Negative for fatigue.  HENT: Negative for mouth sores and mouth dryness.   Eyes: Negative for dryness.  Respiratory: Negative for shortness of breath.   Gastrointestinal: Negative for constipation and diarrhea.  Musculoskeletal: Negative for myalgias and myalgias.  Skin: Negative for sensitivity to sunlight.  Psychiatric/Behavioral: Negative for decreased concentration and sleep disturbance.    PMFS History:  There are no active problems to display for this patient.   Past Medical History:  Diagnosis Date  . Migraines   . Osteoarthritis   . Psoriasis   . Psoriatic arthritis (HCC)     Family History  Problem Relation Age of Onset  . Cancer Mother     Lung  . Heart disease Father    No past surgical history on file. Social  History   Social History Narrative  . No narrative on file     Objective: Vital Signs: BP 120/66 (BP Location: Right Arm, Patient Position: Sitting, Cuff Size: Small)   Pulse 61   Resp 14   Ht 5\' 3"  (1.6 m)   Wt 190 lb (86.2 kg)   LMP 07/15/2013   BMI 33.66 kg/m    Physical Exam  Constitutional: She is oriented to person, place, and time. She appears well-developed and well-nourished.  HENT:  Head: Normocephalic and atraumatic.  Eyes: EOM are normal. Pupils are equal, round, and reactive to light.  Cardiovascular: Normal rate, regular rhythm and normal heart sounds.  Exam reveals no gallop and no friction rub.   No murmur heard. Pulmonary/Chest: Effort normal and breath sounds normal. She has no wheezes. She has no rales.  Abdominal: Soft. Bowel sounds are normal. She exhibits no distension. There is no tenderness. There is no guarding. No hernia.  Musculoskeletal: Normal range of motion. She exhibits no edema, tenderness or deformity.  Lymphadenopathy:    She has no cervical adenopathy.  Neurological: She is alert and oriented to person, place, and time. Coordination normal.  Skin: Skin is warm and dry. Capillary refill takes less than 2 seconds. No rash noted.  Psychiatric: She has a normal mood and affect. Her behavior is normal.     Musculoskeletal Exam:  Full range of motion of  all joints Grip strength is equal and strong bilaterally Fiber myalgia tender points are all absent  CDAI Exam: CDAI Homunculus Exam:   Joint Counts:  CDAI Tender Joint count: 0 CDAI Swollen Joint count: 0  Global Assessments:  Patient Global Assessment: 0 Provider Global Assessment: 0  No synovitis on examination  Investigation: No additional findings. Labs from September 2017 show CBC with differential and CMP with GFR normal  Imaging: No results found.  Speciality Comments: No specialty comments available.    Procedures:  No procedures performed Allergies: Penicillins    Assessment / Plan:     Visit Diagnoses: Psoriatic arthropathy (HCC)  Other psoriasis  High risk medications (not anticoagulants) long-term use - Adequate Response to MTX 6/ WEEK since May 2017 then 5/week starting sept 2017; - Plan: CBC with Differential/Platelet, COMPLETE METABOLIC PANEL WITH GFR, Comprehensive metabolic panel, CBC with Differential/Platelet  Obesity (BMI 30.0-34.9)    Patient has psoriatic arthritis and psoriasis and is doing well with methotrexate 5 pills per week. On the last visit of 09/28/2015 she was on 6 pills per week but has been doing so well she has gone down to 5 pills per week since September 2017 (approximately). Patient has been doing exceptionally well with this dose and we are very happy for that. I would like to do an ultrasound in April 2017 with Dr. Corliss Skains to confirm that she is responding well to the lower dose of methotrexate. If she is doing exceptionally well he would be great to put her on 4 pills of methotrexate see her again in May 2018 and double check and make sure she still doing well and maintain her on 4 pills. If there are any problems patient can go back up to 5 pills per week.  CBC with differential CMP with GFR today CBC with differential CMP with GFR in 3 months and then every 3 months Ultrasound of bilateral hands and wrists to look rule out synovitis on a Wednesday with Dr. Corliss Skains Return to clinic in 5 months Refill methotrexate 5 per week ninety-day supply with no refills Refill folic acid 2 mg every day ninety-day supply with 4 refills  Orders: Orders Placed This Encounter  Procedures  . CBC with Differential/Platelet  . COMPLETE METABOLIC PANEL WITH GFR  . Comprehensive metabolic panel  . CBC with Differential/Platelet   Meds ordered this encounter  Medications  . methotrexate (RHEUMATREX) 2.5 MG tablet    Sig: Take 5 tablets (12.5 mg total) by mouth once a week.    Dispense:  60 tablet    Refill:  0    Order  Specific Question:   Supervising Provider    Answer:   [name] [[id number]]  . folic acid (FOLVITE) 1 MG tablet    Sig: Take 2 tablets (2 mg total) by mouth daily.    Dispense:  180 tablet    Refill:  4    Order Specific Question:   Supervising Provider    Answer:   [name] [phone]    Face-to-face time spent with patient was 40 minutes. 50% of time was spent in counseling and coordination of care.  Follow-Up Instructions: Return in about 5 months (around 08/30/2016) for PsA, Ps, HRRX (mtx 5/week), OA Hands.   Tawni Pummel, PA-C

## 2016-04-02 ENCOUNTER — Telehealth: Payer: Self-pay | Admitting: Radiology

## 2016-04-02 NOTE — Progress Notes (Signed)
Please inform patient and sent to PCP that  #1 CMP with GFR is normal  #2 CBC with differential is normal.No change in treatment. We can monitor.

## 2016-04-02 NOTE — Telephone Encounter (Signed)
-----   Message from Tawni Pummel, [place] sent at 04/02/2016  8:41 AM EST ----- Please inform patient and sent to PCP that  #1 CMP with GFR is normal  #2 CBC with differential is normal.No change in treatment. We can monitor.

## 2016-04-02 NOTE — Telephone Encounter (Signed)
I have called patient to advise labs are normal  

## 2016-04-25 ENCOUNTER — Other Ambulatory Visit: Payer: Self-pay | Admitting: Rheumatology

## 2016-04-26 NOTE — Telephone Encounter (Signed)
Last Visit: 04/01/16 Next Visit: 08/29/16 Labs: 04/01/16 WNL  Okay to refill MTX?

## 2016-06-12 ENCOUNTER — Other Ambulatory Visit: Payer: Self-pay | Admitting: Rheumatology

## 2016-06-12 NOTE — Telephone Encounter (Signed)
Last Visit: 04/01/16 Next Visit: 08/29/16 Labs: 04/01/16 WNL  Okay to refill Folic Acid?

## 2016-06-22 ENCOUNTER — Ambulatory Visit (INDEPENDENT_AMBULATORY_CARE_PROVIDER_SITE_OTHER): Payer: BC Managed Care – PPO | Admitting: Family Medicine

## 2016-06-22 VITALS — BP 112/60 | HR 68 | Temp 97.6°F | Resp 16 | Ht 63.0 in | Wt 185.0 lb

## 2016-06-22 DIAGNOSIS — R300 Dysuria: Secondary | ICD-10-CM | POA: Diagnosis not present

## 2016-06-22 DIAGNOSIS — Z79631 Long term (current) use of antimetabolite agent: Secondary | ICD-10-CM

## 2016-06-22 DIAGNOSIS — Z79899 Other long term (current) drug therapy: Secondary | ICD-10-CM | POA: Insufficient documentation

## 2016-06-22 DIAGNOSIS — R1031 Right lower quadrant pain: Secondary | ICD-10-CM | POA: Diagnosis not present

## 2016-06-22 DIAGNOSIS — R1032 Left lower quadrant pain: Secondary | ICD-10-CM | POA: Diagnosis not present

## 2016-06-22 LAB — POCT URINALYSIS DIP (MANUAL ENTRY)
Bilirubin, UA: NEGATIVE
Blood, UA: NEGATIVE
Glucose, UA: NEGATIVE
Ketones, POC UA: NEGATIVE
Nitrite, UA: NEGATIVE
Protein Ur, POC: NEGATIVE
Spec Grav, UA: 1.01
Urobilinogen, UA: 0.2
pH, UA: 7

## 2016-06-22 LAB — POC MICROSCOPIC URINALYSIS (UMFC): Mucus: ABSENT

## 2016-06-22 LAB — POCT CBC
Granulocyte percent: 58.1 %G (ref 37–80)
HCT, POC: 37.6 % — AB (ref 37.7–47.9)
Hemoglobin: 13.3 g/dL (ref 12.2–16.2)
Lymph, poc: 2.5 (ref 0.6–3.4)
MCH, POC: 33.5 pg — AB (ref 27–31.2)
MCHC: 35.4 g/dL (ref 31.8–35.4)
MCV: 94.6 fL (ref 80–97)
MID (cbc): 0.6 (ref 0–0.9)
MPV: 7.3 fL (ref 0–99.8)
POC Granulocyte: 4.2 (ref 2–6.9)
POC LYMPH PERCENT: 34.1 %L (ref 10–50)
POC MID %: 7.8 %M (ref 0–12)
Platelet Count, POC: 259 10*3/uL (ref 142–424)
RBC: 3.98 M/uL — AB (ref 4.04–5.48)
RDW, POC: 15.2 %
WBC: 7.2 10*3/uL (ref 4.6–10.2)

## 2016-06-22 NOTE — Patient Instructions (Addendum)
     IF you received an x-ray today, you will receive an invoice from [facility]. Please contact [facility] at [phone] with questions or concerns regarding your invoice.   IF you received labwork today, you will receive an invoice from [facility]. Please contact [facility] at [phone] with questions or concerns regarding your invoice.   Our billing staff will not be able to assist you with questions regarding bills from these companies.  You will be contacted with the lab results as soon as they are available. The fastest way to get your results is to activate your My Chart account. Instructions are located on the last page of this paperwork. If you have not heard from us regarding the results in 2 weeks, please contact this office.      Abdominal Pain, Adult Abdominal pain can be caused by many things. Often, abdominal pain is not serious and it gets better with no treatment or by being treated at home. However, sometimes abdominal pain is serious. Your health care provider will do a medical history and a physical exam to try to determine the cause of your abdominal pain. Follow these instructions at home:  Take over-the-counter and prescription medicines only as told by your health care provider. Do not take a laxative unless told by your health care provider.  Drink enough fluid to keep your urine clear or pale yellow.  Watch your condition for any changes.  Keep all follow-up visits as told by your health care provider. This is important. Contact a health care provider if:  Your abdominal pain changes or gets worse.  You are not hungry or you lose weight without trying.  You are constipated or have diarrhea for more than 2-3 days.  You have pain when you urinate or have a bowel movement.  Your abdominal pain wakes you up at night.  Your pain gets worse with meals, after eating, or with certain foods.  You are throwing up and cannot keep anything  down.  You have a fever. Get help right away if:  Your pain does not go away as soon as your health care provider told you to expect.  You cannot stop throwing up.  Your pain is only in areas of the abdomen, such as the right side or the left lower portion of the abdomen.  You have bloody or black stools, or stools that look like tar.  You have severe pain, cramping, or bloating in your abdomen.  You have signs of dehydration, such as:  Dark urine, very little urine, or no urine.  Cracked lips.  Dry mouth.  Sunken eyes.  Sleepiness.  Weakness. This information is not intended to replace advice given to you by your health care provider. Make sure you discuss any questions you have with your health care provider. Document Released: 02/06/2005 Document Revised: 11/17/2015 Document Reviewed: 10/11/2015 Elsevier Interactive Patient Education  2017 [employer] Inc[employer]  

## 2016-06-22 NOTE — Progress Notes (Signed)
Chief Complaint  Patient presents with  . Abdominal Pain    Right lower quadrant    HPI New Patient with Acute Abdominal Pain Abdominal Pain: Patient complains of dysuria, pain in the RLQ and suprapubic pressure She has had symptoms for 4 days ago. Patient also complains of back pain. Patient denies congestion, headache, stomach ache and vaginal discharge. Patient does not have a history of recurrent UTI.  Patient does not have a history of pyelonephritis.   She reports that when this all started she thought that this was due to working out too hard on the treadmill.  She denies flank pain She denies blood in her urine  She reports that she has her appendix. She denies history of diverticulosis  Immunosuppressed state She has been on methotrexate for more than 5 years  She denies fevers or chills She denies anorexia She denies nausea or vomiting  Past Medical History:  Diagnosis Date  . Migraines   . Osteoarthritis   . Psoriasis   . Psoriatic arthritis (HCC)     Current Outpatient Prescriptions  Medication Sig Dispense Refill  . almotriptan (AXERT) 12.5 MG tablet Take 12.5 mg by mouth as needed for migraine. may repeat in 2 hours if needed    . atenolol (TENORMIN) 50 MG tablet     . folic acid (FOLVITE) 1 MG tablet TAKE 2 TABLETS BY MOUTH EVERY DAY 180 tablet 1  . methotrexate (RHEUMATREX) 2.5 MG tablet TAKE 8 TABS BY MOUTH EVERY WEEK 96 tablet 0   No current facility-administered medications for this visit.     Allergies:  Allergies  Allergen Reactions  . Penicillins Rash    No past surgical history on file.  Social History   Social History  . Marital status: Married    Spouse name: N/A  . Number of children: N/A  . Years of education: N/A   Social History Main Topics  . Smoking status: Never Smoker  . Smokeless tobacco: Never Used  . Alcohol use No  . Drug use: No  . Sexual activity: Not Asked   Other Topics Concern  . None   Social History  Narrative  . None    Review of Systems  Constitutional: Negative for chills, fever and weight loss.  Genitourinary:       See hpi  Musculoskeletal: Negative for back pain and joint pain.    Objective: Vitals:   06/22/16 1401  BP: 112/60  Pulse: 68  Resp: 16  Temp: 97.6 F (36.4 C)  TempSrc: Oral  SpO2: 99%  Weight: 185 lb (83.9 kg)  Height: 5\' 3"  (1.6 m)    Physical Exam  Constitutional: She is oriented to person, place, and time. She appears well-developed and well-nourished.  HENT:  Head: Normocephalic and atraumatic.  Eyes: Conjunctivae and EOM are normal.  Cardiovascular: Normal rate, regular rhythm, normal heart sounds and intact distal pulses.   No murmur heard. Pulmonary/Chest: Effort normal and breath sounds normal. No respiratory distress. She has no wheezes.  Abdominal: Soft. Bowel sounds are normal. There is tenderness in the right lower quadrant, suprapubic area and left lower quadrant. There is tenderness at McBurney's point. There is no rigidity, no rebound, no guarding and no CVA tenderness.    Neurological: She is alert and oriented to person, place, and time.    Assessment and Plan Roza was seen today for abdominal pain.  Diagnoses and all orders for this visit:  Dysuria- normal result -     POCT urinalysis  dipstick -     POCT Microscopic Urinalysis ([facility])  RLQ abdominal pain- in addition to RLQ pt also has LLQ pain therefore concern for diverticular disease She had positive Psoas sign thus will get imaging study She is scheduled for CT abd/pelvis on Monday 06/24/16 -     POCT CBC -     Comprehensive metabolic panel -     CT Abdomen Pelvis W Contrast; Future  Abdominal pain, left lower quadrant- will send for CT as an outpatient -     CT Abdomen Pelvis W Contrast; Future  Methotrexate, long term, current use- explained to patient that she should be evaluated because she may not mount a response to infection   A total of 45 minutes were  spent face-to-face with the patient during this encounter and over half of that time was spent on counseling and coordination of care.   Akeel Reffner A Nautia Lem

## 2016-06-23 LAB — COMPREHENSIVE METABOLIC PANEL
ALT: 15 IU/L (ref 0–32)
AST: 19 IU/L (ref 0–40)
Albumin/Globulin Ratio: 1.6 (ref 1.2–2.2)
Albumin: 4 g/dL (ref 3.5–5.5)
Alkaline Phosphatase: 110 IU/L (ref 39–117)
BUN/Creatinine Ratio: 12 (ref 9–23)
BUN: 9 mg/dL (ref 6–24)
Bilirubin Total: 0.4 mg/dL (ref 0.0–1.2)
CO2: 25 mmol/L (ref 18–29)
Calcium: 9 mg/dL (ref 8.7–10.2)
Chloride: 103 mmol/L (ref 96–106)
Creatinine, Ser: 0.73 mg/dL (ref 0.57–1.00)
GFR calc Af Amer: 109 mL/min/{1.73_m2} (ref >59)
GFR calc non Af Amer: 94 mL/min/{1.73_m2} (ref >59)
Globulin, Total: 2.5 g/dL (ref 1.5–4.5)
Glucose: 92 mg/dL (ref 65–99)
Potassium: 3.9 mmol/L (ref 3.5–5.2)
Sodium: 143 mmol/L (ref 134–144)
Total Protein: 6.5 g/dL (ref 6.0–8.5)

## 2016-06-24 ENCOUNTER — Ambulatory Visit (HOSPITAL_COMMUNITY): Payer: BC Managed Care – PPO

## 2016-06-24 ENCOUNTER — Inpatient Hospital Stay (HOSPITAL_COMMUNITY): Admission: RE | Admit: 2016-06-24 | Payer: BC Managed Care – PPO | Source: Ambulatory Visit

## 2016-07-11 ENCOUNTER — Other Ambulatory Visit: Payer: Self-pay | Admitting: Nurse Practitioner

## 2016-07-11 DIAGNOSIS — Z1231 Encounter for screening mammogram for malignant neoplasm of breast: Secondary | ICD-10-CM

## 2016-07-26 ENCOUNTER — Ambulatory Visit
Admission: RE | Admit: 2016-07-26 | Discharge: 2016-07-26 | Disposition: A | Discharge status: Home / Self Care | Payer: BC Managed Care – PPO | Source: Ambulatory Visit | Attending: Nurse Practitioner | Admitting: Nurse Practitioner

## 2016-07-26 DIAGNOSIS — Z1231 Encounter for screening mammogram for malignant neoplasm of breast: Secondary | ICD-10-CM

## 2016-08-16 NOTE — Progress Notes (Signed)
Assessment / Plan:     Visit Diagnoses: Psoriatic arthropathy (HCC)  Other psoriasis  High risk medications (not anticoagulants) long-term use - Adequate Response to MTX 6/ WEEK since May 2017 then 5/week starting sept 2017; - Plan: CBC with Differential/Platelet, COMPLETE METABOLIC PANEL WITH GFR, Comprehensive metabolic panel, CBC with Differential/Platelet  Obesity (BMI 30.0-34.9)    Patient has psoriatic arthritis and psoriasis and is doing well with methotrexate 5 pills per week. On the last visit of 09/28/2015 she was on 6 pills per week but has been doing so well she has gone down to 5 pills per week since September 2017 (approximately). Patient has been doing exceptionally well with this dose and we are very happy for that. I would like to do an ultrasound in April 2017 with Dr. Corliss Skains to confirm that she is responding well to the lower dose of methotrexate. If she is doing exceptionally well he would be great to put her on 4 pills of methotrexate see her again in May 2018 and double check and make sure she still doing well and maintain her on 4 pills. If there are any problems patient can go back up to 5 pills per week.  CBC with differential CMP with GFR today CBC with differential CMP with GFR in 3 months and then every 3 months Ultrasound of bilateral hands and wrists to look rule out synovitis on a Wednesday with Dr. Corliss Skains Return to clinic in 5 months Refill methotrexate 5 per week ninety-day supply with no refills Refill folic acid 2 mg every day ninety-day supply with 4 refills

## 2016-08-21 ENCOUNTER — Inpatient Hospital Stay (INDEPENDENT_AMBULATORY_CARE_PROVIDER_SITE_OTHER): Payer: BC Managed Care – PPO

## 2016-08-21 ENCOUNTER — Ambulatory Visit (INDEPENDENT_AMBULATORY_CARE_PROVIDER_SITE_OTHER): Payer: BC Managed Care – PPO | Admitting: Rheumatology

## 2016-08-21 DIAGNOSIS — M79642 Pain in left hand: Secondary | ICD-10-CM

## 2016-08-21 DIAGNOSIS — M79641 Pain in right hand: Secondary | ICD-10-CM

## 2016-08-22 ENCOUNTER — Telehealth: Payer: Self-pay | Admitting: Rheumatology

## 2016-08-22 NOTE — Telephone Encounter (Signed)
She needs a fu visit which should be 5-6 months from her last regular visit. We discussed only [place] yesterday. No exam was performed

## 2016-08-22 NOTE — Telephone Encounter (Signed)
Left message to advise patient she needs to keep her appointment for 08/29/16.

## 2016-08-22 NOTE — Telephone Encounter (Signed)
Patient called this morning wanting to know if she needs to keep her April 19th appointment since Dr. Corliss Skains went over everything with her yesterday.  CB#[phone].  Thank you.

## 2016-08-27 DIAGNOSIS — M79642 Pain in left hand: Principal | ICD-10-CM

## 2016-08-27 DIAGNOSIS — Z79899 Other long term (current) drug therapy: Secondary | ICD-10-CM | POA: Insufficient documentation

## 2016-08-27 DIAGNOSIS — L4050 Arthropathic psoriasis, unspecified: Secondary | ICD-10-CM | POA: Insufficient documentation

## 2016-08-27 DIAGNOSIS — L408 Other psoriasis: Secondary | ICD-10-CM | POA: Insufficient documentation

## 2016-08-27 DIAGNOSIS — M79641 Pain in right hand: Secondary | ICD-10-CM | POA: Insufficient documentation

## 2016-08-27 DIAGNOSIS — Z8639 Personal history of other endocrine, nutritional and metabolic disease: Secondary | ICD-10-CM | POA: Insufficient documentation

## 2016-08-27 NOTE — Progress Notes (Signed)
Office Visit Note  Patient: [name]             Date of Birth: 03-Aug-1960           MRN: [id number]             PCP: No PCP Per Patient Referring: No ref. provider found Visit Date: 08/29/2016 Occupation: _0 @    Subjective:  Follow-up   History of Present Illness: [name] is a 56 y.o. female   Patient has a history of psoriasis and psoriatic arthritis. She's been doing exceptionally well. As a result we weren't from 6 pills to 5 pills of methotrexate weekly. We wanted to confirm that she was responding well to that medication so she came in 08/21/2016 for ultrasound study.  Please see Epic for full details on that ultrasound study. Because patient's ultrasound was very good, Dr. Estanislado Pandy recommended that she goes from 5 pills to 4 pills. Patient is been on this new dose and doing well. No flare. No morning stiffness, no joint pain no joint swelling.  We would like the patient to continue on 4 pills every week but I'm going to write a prescription for 5 pills a week just in case we need to increase her back to 5 pills per week.  Patient has plenty of folic acid at home at this time.  No psoriasis flare.  No joint pain swelling and stiffness otherwise.  Activities of Daily Living:  Patient reports morning stiffness for 5 minutes.   Patient Denies nocturnal pain.  Difficulty dressing/grooming: Denies Difficulty climbing stairs: Denies Difficulty getting out of chair: Denies Difficulty using hands for taps, buttons, cutlery, and/or writing: Denies   Review of Systems  Constitutional: Negative for fatigue.  HENT: Negative for mouth sores and mouth dryness.   Eyes: Negative for dryness.  Respiratory: Negative for shortness of breath.   Gastrointestinal: Negative for constipation and diarrhea.  Musculoskeletal: Negative for myalgias and myalgias.  Skin: Negative for sensitivity to sunlight.  Psychiatric/Behavioral: Negative for decreased  concentration and sleep disturbance.    PMFS History:  Patient Active Problem List   Diagnosis Date Noted  . Pain in both hands 08/27/2016  . High risk medication use 08/27/2016  . Psoriatic arthritis ([place]) 08/27/2016  . History of obesity 08/27/2016  . Other psoriasis 08/27/2016  . Methotrexate, long term, current use 06/22/2016    Past Medical History:  Diagnosis Date  . Migraines   . Osteoarthritis   . Psoriasis   . Psoriatic arthritis ([place])     Family History  Problem Relation Age of Onset  . Cancer Mother     Lung  . Heart disease Father    History reviewed. No pertinent surgical history. Social History   Social History Narrative  . No narrative on file     Objective: Vital Signs: BP 112/68   Pulse 74   Resp 14   Ht _1  (1.6 m)   Wt 180 lb (81.6 kg)   LMP 07/15/2013   BMI 31.89 kg/m    Physical Exam  Constitutional: She is oriented to person, place, and time. She appears well-developed and well-nourished.  HENT:  Head: Normocephalic and atraumatic.  Eyes: EOM are normal. Pupils are equal, round, and reactive to light.  Cardiovascular: Normal rate, regular rhythm and normal heart sounds.  Exam reveals no gallop and no friction rub.   No murmur heard. Pulmonary/Chest: Effort normal and breath sounds normal. She has no wheezes. She has no  rales.  Abdominal: Soft. Bowel sounds are normal. She exhibits no distension. There is no tenderness. There is no guarding. No hernia.  Musculoskeletal: Normal range of motion. She exhibits no edema, tenderness or deformity.  Lymphadenopathy:    She has no cervical adenopathy.  Neurological: She is alert and oriented to person, place, and time. Coordination normal.  Skin: Skin is warm and dry. Capillary refill takes less than 2 seconds. No rash noted.  Psychiatric: She has a normal mood and affect. Her behavior is normal.  Nursing note and vitals reviewed.    Musculoskeletal Exam:  Full range of motion of all  joints Grip strength is equal and strong bilaterally Fiber myalgia tender points are all absent  CDAI Exam: CDAI Homunculus Exam:   Joint Counts:  CDAI Tender Joint count: 0 CDAI Swollen Joint count: 0  Global Assessments:  Patient Global Assessment: 2 Provider Global Assessment: 2  CDAI Calculated Score: 4   Investigation: Findings:  Adequate Response to MTX 6/ WEEK since May 2017 then 5/week starting sept 2017  Office Visit on 06/22/2016  Component Date Value Ref Range Status  . Color, UA 06/22/2016 yellow  yellow Final  . Clarity, UA 06/22/2016 clear  clear Final  . Glucose, UA 06/22/2016 negative  negative Final  . Bilirubin, UA 06/22/2016 negative  negative Final  . Ketones, POC UA 06/22/2016 negative  negative Final  . Spec Grav, UA 06/22/2016 1.010   Final  . Blood, UA 06/22/2016 negative  negative Final  . pH, UA 06/22/2016 7.0   Final  . Protein Ur, POC 06/22/2016 negative  negative Final  . Urobilinogen, UA 06/22/2016 0.2   Final  . Nitrite, UA 06/22/2016 Negative  Negative Final  . Leukocytes, UA 06/22/2016 Trace* Negative Final  . WBC,UR,HPF,POC 06/22/2016 None  None WBC/hpf Final  . RBC,UR,HPF,POC 06/22/2016 None  None RBC/hpf Final  . Bacteria 06/22/2016 None  None, Too numerous to count Final  . Mucus 06/22/2016 Absent  Absent Final  . Epithelial Cells, UR Per Microscopy 06/22/2016 Few* None, Too numerous to count cells/hpf Final  . WBC 06/22/2016 7.2  4.6 - 10.2 K/uL Final  . Lymph, poc 06/22/2016 2.5  0.6 - 3.4 Final  . POC LYMPH PERCENT 06/22/2016 34.1  10 - 50 %L Final  . MID (cbc) 06/22/2016 0.6  0 - 0.9 Final  . POC MID % 06/22/2016 7.8  0 - 12 %M Final  . POC Granulocyte 06/22/2016 4.2  2 - 6.9 Final  . Granulocyte percent 06/22/2016 58.1  37 - 80 %G Final  . RBC 06/22/2016 3.98* 4.04 - 5.48 M/uL Final  . Hemoglobin 06/22/2016 13.3  12.2 - 16.2 g/dL Final  . HCT, POC 06/22/2016 37.6* 37.7 - 47.9 % Final  . MCV 06/22/2016 94.6  80 - 97 fL Final   . MCH, POC 06/22/2016 33.5* 27 - 31.2 pg Final  . MCHC 06/22/2016 35.4  31.8 - 35.4 g/dL Final  . RDW, POC 06/22/2016 15.2  % Final  . Platelet Count, POC 06/22/2016 259  142 - 424 K/uL Final  . MPV 06/22/2016 7.3  0 - 99.8 fL Final  . Glucose 06/22/2016 92  65 - 99 mg/dL Final  . BUN 06/22/2016 9  6 - 24 mg/dL Final  . Creatinine, Ser 06/22/2016 0.73  0.57 - 1.00 mg/dL Final  . GFR calc non Af Amer 06/22/2016 94  >59 mL/min/1.73 Final  . GFR calc Af Amer 06/22/2016 109  >59 mL/min/1.73 Final  . BUN/Creatinine Ratio  06/22/2016 12  9 - 23 Final  . Sodium 06/22/2016 143  134 - 144 mmol/L Final  . Potassium 06/22/2016 3.9  3.5 - 5.2 mmol/L Final  . Chloride 06/22/2016 103  96 - 106 mmol/L Final  . CO2 06/22/2016 25  18 - 29 mmol/L Final  . Calcium 06/22/2016 9.0  8.7 - 10.2 mg/dL Final  . Total Protein 06/22/2016 6.5  6.0 - 8.5 g/dL Final  . Albumin 06/22/2016 4.0  3.5 - 5.5 g/dL Final  . Globulin, Total 06/22/2016 2.5  1.5 - 4.5 g/dL Final  . Albumin/Globulin Ratio 06/22/2016 1.6  1.2 - 2.2 Final  . Bilirubin Total 06/22/2016 0.4  0.0 - 1.2 mg/dL Final  . Alkaline Phosphatase 06/22/2016 110  39 - 117 IU/L Final  . AST 06/22/2016 19  0 - 40 IU/L Final  . ALT 06/22/2016 15  0 - 32 IU/L Final  Office Visit on 04/01/2016  Component Date Value Ref Range Status  . WBC 04/01/2016 8.2  3.8 - 10.8 K/uL Final  . RBC 04/01/2016 4.45  3.80 - 5.10 MIL/uL Final  . Hemoglobin 04/01/2016 14.0  11.7 - 15.5 g/dL Final  . HCT 04/01/2016 41.7  35.0 - 45.0 % Final  . MCV 04/01/2016 93.7  80.0 - 100.0 fL Final  . MCH 04/01/2016 31.5  27.0 - 33.0 pg Final  . MCHC 04/01/2016 33.6  32.0 - 36.0 g/dL Final  . RDW 04/01/2016 15.2* 11.0 - 15.0 % Final  . Platelets 04/01/2016 311  140 - 400 K/uL Final  . MPV 04/01/2016 10.0  7.5 - 12.5 fL Final  . Neutro Abs 04/01/2016 3854  1,500 - 7,800 cells/uL Final  . Lymphs Abs 04/01/2016 3362  850 - 3,900 cells/uL Final  . Monocytes Absolute 04/01/2016 738  200 - 950  cells/uL Final  . Eosinophils Absolute 04/01/2016 164  15 - 500 cells/uL Final  . Basophils Absolute 04/01/2016 82  0 - 200 cells/uL Final  . Neutrophils Relative % 04/01/2016 47  % Final  . Lymphocytes Relative 04/01/2016 41  % Final  . Monocytes Relative 04/01/2016 9  % Final  . Eosinophils Relative 04/01/2016 2  % Final  . Basophils Relative 04/01/2016 1  % Final  . Smear Review 04/01/2016 Criteria for review not met   Final  . Sodium 04/01/2016 140  135 - 146 mmol/L Final  . Potassium 04/01/2016 4.7  3.5 - 5.3 mmol/L Final  . Chloride 04/01/2016 105  98 - 110 mmol/L Final  . CO2 04/01/2016 27  20 - 31 mmol/L Final  . Glucose, Bld 04/01/2016 97  65 - 99 mg/dL Final  . BUN 04/01/2016 10  7 - 25 mg/dL Final  . Creat 04/01/2016 0.63  0.50 - 1.05 mg/dL Final   Comment:   For patients > or = 54 years of age: The upper reference limit for Creatinine is approximately 13% higher for people identified as African-American.     . Total Bilirubin 04/01/2016 0.8  0.2 - 1.2 mg/dL Final  . Alkaline Phosphatase 04/01/2016 87  33 - 130 U/L Final  . AST 04/01/2016 23  10 - 35 U/L Final  . ALT 04/01/2016 13  6 - 29 U/L Final  . Total Protein 04/01/2016 6.8  6.1 - 8.1 g/dL Final  . Albumin 04/01/2016 3.9  3.6 - 5.1 g/dL Final  . Calcium 04/01/2016 9.5  8.6 - 10.4 mg/dL Final  . GFR, Est African American 04/01/2016 >89  >=60 mL/min Final  . GFR,  Est Non African American 04/01/2016 >89  >=60 mL/min Final      Imaging: [place] Extrem Up Bilat Comp  Result Date: 08/21/2016 Ultrasound examination of bilateral hands was performed per EULAR recommendations. Using 12 MHz transducer, grayscale and power Doppler bilateral second, third, and fifth MCP joints and bilateral wrist joints both dorsal and volar aspects were evaluated to look for synovitis or tenosynovitis. The findings were there was no synovitis or tenosynovitis on ultrasound examination. Right median nerve was 0.08 cm squares which was within  normal limits and left median nerve was 0.08 cm squares which was within normal limits Impression: Ultrasound examination of bilateral hands did not show any synovitis or tenosynovitis and median nerves are within normal limits.   Speciality Comments: No specialty comments available.    Procedures:  No procedures performed Allergies: Penicillins   Assessment / Plan:     Visit Diagnoses: Pain in both hands  Psoriatic arthropathy (HCC)  Other psoriasis  High risk medication use - MTX-2.45m 5 pills per week.  - Plan: CBC with Differential/Platelet, COMPLETE METABOLIC PANEL WITH GFR  History of obesity   Plan: #1: Psoriasis and psoriatic arthritis.  #2: High-risk prescription. On methotrexate 5 per week. After the ultrasound that we did on 08/21/2016, patient's ultrasound was negative for synovitis and therefore we recommended patient do 4 pills per week. She has started the 4 pills per week and so far is doing well. I'm refilling patient's methotrexate prescription at 5 per week just in case we need to increase it to a higher dose in the near future.  #3: Return to clinic in 5 months  #4: CBC with differential, CMP with GFR today and then again in 3 months. When patient returns to clinic in 5 months, we can do CBC with differential and CMP with GFR at that visit.  Orders: Orders Placed This Encounter  Procedures  . CBC with Differential/Platelet  . COMPLETE METABOLIC PANEL WITH GFR   Meds ordered this encounter  Medications  . methotrexate (RHEUMATREX) 2.5 MG tablet    Sig: Take 5 tablets (12.5 mg total) by mouth once a week. Caution:Chemotherapy. Protect from light.    Dispense:  65 tablet    Refill:  0    Order Specific Question:   Supervising Provider    Answer:   DBo Merino[[phone]   Face-to-face time spent with patient was 30 minutes. 50% of time was spent in counseling and coordination of care.  Follow-Up Instructions: Return in about 5 months (around  01/29/2017) for RA, MTX 4/wk, folic 1/d, doing well..Eliezer Lofts PA-C  Note - This record has been created using DEditor, commissioning  Chart creation errors have been sought, but may not always  have been located. Such creation errors do not reflect on  the standard of medical care.

## 2016-08-29 ENCOUNTER — Ambulatory Visit (INDEPENDENT_AMBULATORY_CARE_PROVIDER_SITE_OTHER): Payer: BC Managed Care – PPO | Admitting: Rheumatology

## 2016-08-29 ENCOUNTER — Encounter: Payer: Self-pay | Admitting: Rheumatology

## 2016-08-29 VITALS — BP 112/68 | HR 74 | Resp 14 | Ht 63.0 in | Wt 180.0 lb

## 2016-08-29 DIAGNOSIS — L4050 Arthropathic psoriasis, unspecified: Secondary | ICD-10-CM

## 2016-08-29 DIAGNOSIS — L408 Other psoriasis: Secondary | ICD-10-CM | POA: Diagnosis not present

## 2016-08-29 DIAGNOSIS — M79641 Pain in right hand: Secondary | ICD-10-CM

## 2016-08-29 DIAGNOSIS — M79642 Pain in left hand: Secondary | ICD-10-CM

## 2016-08-29 DIAGNOSIS — Z8639 Personal history of other endocrine, nutritional and metabolic disease: Secondary | ICD-10-CM

## 2016-08-29 DIAGNOSIS — Z79899 Other long term (current) drug therapy: Secondary | ICD-10-CM

## 2016-08-29 LAB — COMPLETE METABOLIC PANEL WITH GFR
ALT: 12 U/L (ref 6–29)
AST: 20 U/L (ref 10–35)
Albumin: 3.8 g/dL (ref 3.6–5.1)
Alkaline Phosphatase: 99 U/L (ref 33–130)
BUN: 11 mg/dL (ref 7–25)
CO2: 27 mmol/L (ref 20–31)
Calcium: 9.2 mg/dL (ref 8.6–10.4)
Chloride: 105 mmol/L (ref 98–110)
Creat: 0.66 mg/dL (ref 0.50–1.05)
GFR, Est African American: >89 mL/min (ref >=60)
GFR, Est Non African American: >89 mL/min (ref >=60)
Glucose, Bld: 98 mg/dL (ref 65–99)
Potassium: 4.8 mmol/L (ref 3.5–5.3)
Sodium: 141 mmol/L (ref 135–146)
Total Bilirubin: 0.5 mg/dL (ref 0.2–1.2)
Total Protein: 6.8 g/dL (ref 6.1–8.1)

## 2016-08-29 LAB — CBC WITH DIFFERENTIAL/PLATELET
Basophils Absolute: 89 cells/uL (ref 0–200)
Basophils Relative: 1 %
Eosinophils Absolute: 178 cells/uL (ref 15–500)
Eosinophils Relative: 2 %
HCT: 41 % (ref 35.0–45.0)
Hemoglobin: 13.7 g/dL (ref 11.7–15.5)
Lymphocytes Relative: 36 %
Lymphs Abs: 3204 cells/uL (ref 850–3900)
MCH: 32.4 pg (ref 27.0–33.0)
MCHC: 33.4 g/dL (ref 32.0–36.0)
MCV: 96.9 fL (ref 80.0–100.0)
MPV: 9.4 fL (ref 7.5–12.5)
Monocytes Absolute: 890 cells/uL (ref 200–950)
Monocytes Relative: 10 %
Neutro Abs: 4539 cells/uL (ref 1500–7800)
Neutrophils Relative %: 51 %
Platelets: 305 10*3/uL (ref 140–400)
RBC: 4.23 MIL/uL (ref 3.80–5.10)
RDW: 14.6 % (ref 11.0–15.0)
WBC: 8.9 10*3/uL (ref 3.8–10.8)

## 2016-08-29 MED ORDER — METHOTREXATE 2.5 MG PO TABS: 12.5000 mg | ORAL_TABLET | ORAL | 0 refills | Status: DC

## 2016-08-30 ENCOUNTER — Telehealth: Payer: Self-pay | Admitting: Radiology

## 2016-08-30 NOTE — Telephone Encounter (Signed)
-----   Message from Tawni Pummel, [place] sent at 08/30/2016  2:04 PM EDT ----- Labs are normal

## 2016-08-30 NOTE — Telephone Encounter (Signed)
I have called patient to advise labs are normal  

## 2016-12-21 ENCOUNTER — Other Ambulatory Visit: Payer: Self-pay | Admitting: Rheumatology

## 2016-12-23 NOTE — Telephone Encounter (Signed)
Last Visit: 08/29/16 Next Visit: 02/03/17 Labs: 08/29/16 WNL  Okay to refill per Dr. Corliss Skains

## 2017-01-10 ENCOUNTER — Encounter: Payer: Self-pay | Admitting: Rheumatology

## 2017-01-10 ENCOUNTER — Ambulatory Visit (INDEPENDENT_AMBULATORY_CARE_PROVIDER_SITE_OTHER): Payer: BC Managed Care – PPO | Admitting: Rheumatology

## 2017-01-10 VITALS — BP 112/61 | HR 59 | Ht 63.0 in | Wt 185.0 lb

## 2017-01-10 DIAGNOSIS — M79641 Pain in right hand: Secondary | ICD-10-CM

## 2017-01-10 DIAGNOSIS — M79642 Pain in left hand: Secondary | ICD-10-CM

## 2017-01-10 DIAGNOSIS — Z8639 Personal history of other endocrine, nutritional and metabolic disease: Secondary | ICD-10-CM

## 2017-01-10 DIAGNOSIS — L408 Other psoriasis: Secondary | ICD-10-CM

## 2017-01-10 DIAGNOSIS — Z79899 Other long term (current) drug therapy: Secondary | ICD-10-CM

## 2017-01-10 DIAGNOSIS — L4050 Arthropathic psoriasis, unspecified: Secondary | ICD-10-CM | POA: Diagnosis not present

## 2017-01-10 DIAGNOSIS — R202 Paresthesia of skin: Secondary | ICD-10-CM

## 2017-01-10 LAB — CBC WITH DIFFERENTIAL/PLATELET
Basophils Absolute: 73 cells/uL (ref 0–200)
Basophils Relative: 1 %
Eosinophils Absolute: 219 cells/uL (ref 15–500)
Eosinophils Relative: 3 %
HCT: 41.4 % (ref 35.0–45.0)
Hemoglobin: 13.8 g/dL (ref 11.7–15.5)
Lymphocytes Relative: 27 %
Lymphs Abs: 1971 cells/uL (ref 850–3900)
MCH: 32.2 pg (ref 27.0–33.0)
MCHC: 33.3 g/dL (ref 32.0–36.0)
MCV: 96.5 fL (ref 80.0–100.0)
MPV: 9.9 fL (ref 7.5–12.5)
Monocytes Absolute: 657 cells/uL (ref 200–950)
Monocytes Relative: 9 %
Neutro Abs: 4380 cells/uL (ref 1500–7800)
Neutrophils Relative %: 60 %
Platelets: 296 10*3/uL (ref 140–400)
RBC: 4.29 MIL/uL (ref 3.80–5.10)
RDW: 14.7 % (ref 11.0–15.0)
WBC: 7.3 10*3/uL (ref 3.8–10.8)

## 2017-01-10 NOTE — Patient Instructions (Signed)
Standing Labs We placed an order today for your standing lab work.    Please come back and get your standing labs in December and every 3 months  We have open lab Monday through Friday from 8:30-11:30 AM and 1:30-4 PM at the office of Dr. Jshaun Abernathy.   The office is located at [address], [address], [place], [place] [address] No appointment is necessary.   Labs are drawn by [facility].  You may receive a bill from [facility] for your lab work. If you have any questions regarding directions or hours of operation,  please call [phone].    

## 2017-01-10 NOTE — Progress Notes (Signed)
Office Visit Note  Patient: [name]             Date of Birth: 1960/12/16           MRN: [id number]             PCP: Patient, No Pcp Per Referring: No ref. provider found Visit Date: 01/10/2017 Occupation: @GUAROCC @    Subjective:  Lower back pain   History of Present Illness: [name] is a 56 y.o. female with history of psoriatic arthritis and psoriasis. She states about a month ago she had a back sprain which lasted for some time. She continued to have some discomfort in her back since then. After that she started having discomfort in her left lower extremity. Patient reports that she was having difficulty crossing her leg. The symptoms have improved except for some numbness in her left lower extremity. Recently she's been experiencing numbness in her left first second and third finger. She denies any joint pain or joint swelling.  Activities of Daily Living:  Patient reports morning stiffness for 20 minutes.   Patient Denies nocturnal pain.  Difficulty dressing/grooming: Denies Difficulty climbing stairs: Denies Difficulty getting out of chair: Denies Difficulty using hands for taps, buttons, cutlery, and/or writing: Denies   Review of Systems  Constitutional: Negative.  Negative for fatigue, night sweats, weight gain, weight loss and weakness.  HENT: Negative.  Negative for mouth sores, trouble swallowing, trouble swallowing, mouth dryness and nose dryness.   Eyes: Negative.  Negative for pain, redness, visual disturbance and dryness.  Respiratory: Negative for cough, shortness of breath and difficulty breathing.   Cardiovascular: Negative.  Negative for chest pain, palpitations, hypertension, irregular heartbeat and swelling in legs/feet.  Gastrointestinal: Negative.  Negative for blood in stool, constipation and diarrhea.  Endocrine: Negative for increased urination.  Genitourinary: Negative for vaginal dryness.  Musculoskeletal: Positive for morning  stiffness. Negative for arthralgias, joint pain, joint swelling, myalgias, muscle weakness, muscle tenderness and myalgias.  Skin: Negative.  Negative for color change, rash, hair loss, skin tightness, ulcers and sensitivity to sunlight.  Allergic/Immunologic: Negative for susceptible to infections.  Neurological: Positive for numbness. Negative for dizziness, headaches, memory loss and night sweats.  Hematological: Negative for swollen glands.  Psychiatric/Behavioral: Negative.  Negative for depressed mood and sleep disturbance. The patient is not nervous/anxious.     PMFS History:  Patient Active Problem List   Diagnosis Date Noted  . Pain in both hands 08/27/2016  . High risk medication use 08/27/2016  . Psoriatic arthritis ([facility]) 08/27/2016  . History of obesity 08/27/2016  . Other psoriasis 08/27/2016  . Methotrexate, long term, current use 06/22/2016    Past Medical History:  Diagnosis Date  . Migraines   . Osteoarthritis   . Psoriasis   . Psoriatic arthritis ([facility])     Family History  Problem Relation Age of Onset  . Cancer Mother        Lung  . Heart disease Father    History reviewed. No pertinent surgical history. Social History   Social History Narrative  . No narrative on file     Objective: Vital Signs: BP 112/61 (BP Location: Left Arm, Patient Position: Sitting, Cuff Size: Normal)   Pulse (!) 59   Ht 5\' 3"  (1.6 m)   Wt 185 lb (83.9 kg)   LMP 07/15/2013   BMI 32.77 kg/m    Physical Exam  Constitutional: She is oriented to person, place, and time. She appears well-developed and  well-nourished.  HENT:  Head: Normocephalic and atraumatic.  Eyes: Conjunctivae and EOM are normal.  Neck: Normal range of motion.  Cardiovascular: Normal rate, regular rhythm, normal heart sounds and intact distal pulses.   Pulmonary/Chest: Effort normal and breath sounds normal.  Abdominal: Soft. Bowel sounds are normal.  Lymphadenopathy:    She has no cervical adenopathy.    Neurological: She is alert and oriented to person, place, and time.  Skin: Skin is warm and dry. Capillary refill takes less than 2 seconds.  Psychiatric: She has a normal mood and affect. Her behavior is normal.  Nursing note and vitals reviewed.    Musculoskeletal Exam: C-spine and thoracic lumbar spine good range of motion. She is some stiffness with range of motion of her lumbar spine. No SI joint tenderness was noted. Shoulder joints although joints wrist joints are good range of motion. She has no synovitis over her wrist joints or MCPs PIPs DIPs. Some thickening of PIP/DIP joints was noted. She has tenderness over left trochanteric bursae. Hip joints knee joints ankles MTPs PIPs DIPs were good range of motion with no synovitis. Patient complains of numbness in her left first second and third finger. Tinel's and manual compression test was negative. Although Phalen's test was positive.  CDAI Exam: CDAI Homunculus Exam:   Joint Counts:  CDAI Tender Joint count: 0 CDAI Swollen Joint count: 0  Global Assessments:  Patient Global Assessment: 3 Provider Global Assessment: 2  CDAI Calculated Score: 5    Investigation: No additional findings. CBC Latest Ref Rng & Units 08/29/2016 06/22/2016 04/01/2016  WBC 3.8 - 10.8 K/uL 8.9 7.2 8.2  Hemoglobin 11.7 - 15.5 g/dL 27.5 17.0 01.7  Hematocrit 35.0 - 45.0 % 41.0 37.6(A) 41.7  Platelets 140 - 400 K/uL 305 - 311   CMP     Component Value Date/Time   NA 141 08/29/2016 1640   NA 143 06/22/2016 1454   K 4.8 08/29/2016 1640   CL 105 08/29/2016 1640   CO2 27 08/29/2016 1640   GLUCOSE 98 08/29/2016 1640   BUN 11 08/29/2016 1640   BUN 9 06/22/2016 1454   CREATININE 0.66 08/29/2016 1640   CALCIUM 9.2 08/29/2016 1640   PROT 6.8 08/29/2016 1640   PROT 6.5 06/22/2016 1454   ALBUMIN 3.8 08/29/2016 1640   ALBUMIN 4.0 06/22/2016 1454   AST 20 08/29/2016 1640   ALT 12 08/29/2016 1640   ALKPHOS 99 08/29/2016 1640   BILITOT 0.5 08/29/2016  1640   BILITOT 0.4 06/22/2016 1454   GFRNONAA >89 08/29/2016 1640   GFRAA >89 08/29/2016 1640    Imaging: No results found.  Speciality Comments: No specialty comments available.    Procedures:  No procedures performed Allergies: Penicillins   Assessment / Plan:     Visit Diagnoses: Psoriatic arthritis (HCC): She has no active synovitis she is doing quite well on methotrexate and low-dose.  Other psoriasis: She has no active psoriasis lesions.  High risk medication use - Methotrexate 5 tablets by mouth every week, folic acid 1 mg by mouth daily - Plan: CBC with Differential/Platelet, COMPLETE METABOLIC PANEL WITH GFR, we will check labs today and then every 3 months to monitor for drug toxicity. CBC with Differential/Platelet, COMPLETE METABOLIC PANEL WITH GFR  Left hand paresthesia - Plan: Ambulatory referral to Physical Medicine Rehab  Pain in both hands: She has some underlying osteoarthritis which contributes to arthralgias.  History of obesity : Weight loss diet and exercise discussed.   Orders: Orders Placed This  Encounter  Procedures  . CBC with Differential/Platelet  . COMPLETE METABOLIC PANEL WITH GFR  . CBC with Differential/Platelet  . COMPLETE METABOLIC PANEL WITH GFR  . Ambulatory referral to Physical Medicine Rehab   No orders of the defined types were placed in this encounter.   Follow-Up Instructions: Return in about 5 months (around 06/12/2017) for Psoriatic arthritis.   [name], MD  Note - This record has been created using Animal nutritionist.  Chart creation errors have been sought, but may not always  have been located. Such creation errors do not reflect on  the standard of medical care.

## 2017-01-11 LAB — COMPLETE METABOLIC PANEL WITH GFR
ALT: 11 U/L (ref 6–29)
AST: 16 U/L (ref 10–35)
Albumin: 3.9 g/dL (ref 3.6–5.1)
Alkaline Phosphatase: 108 U/L (ref 33–130)
BUN: 11 mg/dL (ref 7–25)
CO2: 23 mmol/L (ref 20–32)
Calcium: 9.2 mg/dL (ref 8.6–10.4)
Chloride: 105 mmol/L (ref 98–110)
Creat: 0.72 mg/dL (ref 0.50–1.05)
GFR, Est African American: >89 mL/min (ref >=60)
GFR, Est Non African American: >89 mL/min (ref >=60)
Glucose, Bld: 100 mg/dL — ABNORMAL HIGH (ref 65–99)
Potassium: 5.1 mmol/L (ref 3.5–5.3)
Sodium: 141 mmol/L (ref 135–146)
Total Bilirubin: 0.5 mg/dL (ref 0.2–1.2)
Total Protein: 6.8 g/dL (ref 6.1–8.1)

## 2017-01-14 NOTE — Progress Notes (Signed)
WNL

## 2017-02-03 ENCOUNTER — Ambulatory Visit: Payer: BC Managed Care – PPO | Admitting: Rheumatology

## 2017-02-11 ENCOUNTER — Ambulatory Visit (INDEPENDENT_AMBULATORY_CARE_PROVIDER_SITE_OTHER): Payer: BC Managed Care – PPO | Admitting: Physical Medicine and Rehabilitation

## 2017-02-11 DIAGNOSIS — R202 Paresthesia of skin: Secondary | ICD-10-CM | POA: Diagnosis not present

## 2017-02-11 NOTE — Progress Notes (Deleted)
Patient here today for a left upper extremity nerve study. She is having pain and numbness that radiates to her elbow.

## 2017-02-12 NOTE — Progress Notes (Signed)
   LAKEVA HOLLON - 56 y.o. female MRN [id number]  Date of birth: 04/08/1961  Office Visit Note: Visit Date: 02/11/2017 PCP: Patient, No Pcp Per Referred by: [name], MD  Subjective: Chief Complaint  Patient presents with  . Left Hand - Pain, Numbness   HPI: Mrs. Speece is a 56 year old right-handed female. She is complaining of worsening left hand pain with numbness and tingling in the radial digits radiating up to the elbow. She has a history of psoriatic arthritis on high-risk medications included methotrexate. She's had no prior history of carpal tunnel syndrome or electrodiagnostic studies. She denies any frank radicular pain or specific injury. She does report some left leg numbness recently as well. She is unsure if these are related. She's had no specific headaches or double vision or blurry vision.    ROS Otherwise per HPI.  Assessment & Plan: Visit Diagnoses:  1. Paresthesia of skin     Plan: No additional findings.  Impression: This was a NORMAL electrodiagnostic study of the left upper extremity without evidence of nerve entrapment, cervical radiculopathy or brachial plexopathy.  As you know, this particular electrodiagnostic study cannot rule out chemical radiculitis or sensory only radiculopathy.  Consider cervical MRI if symptoms persist or become more radicular in fashion.  Meds & Orders: No orders of the defined types were placed in this encounter.   Orders Placed This Encounter  Procedures  . NCV with EMG (electromyography)    Follow-up: Return for Dr. Corliss Skains.   Procedures: No procedures performed  No notes on file   Clinical History: No specialty comments available.  She reports that she has never smoked. She has never used smokeless tobacco. No results for input(s): HGBA1C, LABURIC in the last 8760 hours.  Objective:  VS:  HT:    WT:   BMI:     BP:   HR: bpm  TEMP: ( )  RESP:  Physical Exam  Musculoskeletal:  Inspection  reveals no atrophy of the bilateral APB or FDI or hand intrinsics. There is no swelling, color changes, allodynia or dystrophic changes. There is 5 out of 5 strength in the bilateral wrist extension, finger abduction and long finger flexion. There is intact sensation to light touch in all dermatomal and peripheral nerve distributions. There is a negative Hoffmann's test bilaterally.    Ortho Exam Imaging: No results found.  Past Medical/Family/Surgical/Social History: Medications & Allergies reviewed per EMR Patient Active Problem List   Diagnosis Date Noted  . Pain in both hands 08/27/2016  . High risk medication use 08/27/2016  . Psoriatic arthritis (HCC) 08/27/2016  . History of obesity 08/27/2016  . Other psoriasis 08/27/2016  . Methotrexate, long term, current use 06/22/2016   Past Medical History:  Diagnosis Date  . Migraines   . Osteoarthritis   . Psoriasis   . Psoriatic arthritis (HCC)    Family History  Problem Relation Age of Onset  . Cancer Mother        Lung  . Heart disease Father    No past surgical history on file. Social History   Occupational History  . Not on file.   Social History Main Topics  . Smoking status: Never Smoker  . Smokeless tobacco: Never Used  . Alcohol use No  . Drug use: No  . Sexual activity: Not on file

## 2017-02-24 NOTE — Procedures (Signed)
EMG & NCV Findings: All nerve conduction studies (as indicated in the following tables) were within normal limits.    All examined muscles (as indicated in the following table) showed no evidence of electrical instability.    Impression: Essentially NORMAL electrodiagnostic study of the left upper limb.  There is no significant electrodiagnostic evidence of nerve entrapment, brachial plexopathy or cervical radiculopathy.    As you know, purely sensory or demyelinating radiculopathies and chemical radiculitis may not be detected with this particular electrodiagnostic study.  Recommendations: 1.  Follow-up with referring physician. 2.  Continue current management of symptoms.   Nerve Conduction Studies Anti Sensory Summary Table   Stim Site NR Peak (ms) Norm Peak (ms) P-T Amp (V) Norm P-T Amp Site1 Site2 Delta-P (ms) Dist (cm) Vel (m/s) Norm Vel (m/s)  Left Median Acr Palm Anti Sensory (2nd Digit)  30.5C  Wrist    3.5 <3.6 53.6 >10 Wrist Palm 1.6 0.0    Palm    1.9 <2.0 60.6         Left Radial Anti Sensory (Base 1st Digit)  30.5C  Wrist    2.2 <3.1 29.9  Wrist Base 1st Digit 2.2 0.0    Left Ulnar Anti Sensory (5th Digit)  30.3C  Wrist    3.4 <3.7 54.9 >15.0 Wrist 5th Digit 3.4 14.0 41 >38   Motor Summary Table   Stim Site NR Onset (ms) Norm Onset (ms) O-P Amp (mV) Norm O-P Amp Site1 Site2 Delta-0 (ms) Dist (cm) Vel (m/s) Norm Vel (m/s)  Left Median Motor (Abd Poll Brev)  30.6C  Wrist    3.1 <4.2 7.2 >5 Elbow Wrist 3.4 18.0 53 >50  Elbow    6.5  6.3         Left Ulnar Motor (Abd Dig Min)  30.8C  Wrist    2.9 <4.2 7.7 >3 B Elbow Wrist 2.6 17.5 67 >53  B Elbow    5.5  7.4  A Elbow B Elbow 1.2 9.0 75 >53  A Elbow    6.7  7.4          EMG   Side Muscle Nerve Root Ins Act Fibs Psw Amp Dur Poly Recrt Int Dennie Bible Comment  Left Abd Poll Brev Median C8-T1 Nml Nml Nml Nml Nml 0 Nml Nml   Left 1stDorInt Ulnar C8-T1 Nml Nml Nml Nml Nml 0 Nml Nml   Left PronatorTeres Median C6-7 Nml Nml  Nml Nml Nml 0 Nml Nml   Left Biceps Musculocut C5-6 Nml Nml Nml Nml Nml 0 Nml Nml   Left Deltoid Axillary C5-6 Nml Nml Nml Nml Nml 0 Nml Nml     Nerve Conduction Studies Anti Sensory Left/Right Comparison   Stim Site L Lat (ms) R Lat (ms) L-R Lat (ms) L Amp (V) R Amp (V) L-R Amp (%) Site1 Site2 L Vel (m/s) R Vel (m/s) L-R Vel (m/s)  Median Acr Palm Anti Sensory (2nd Digit)  30.5C  Wrist 3.5   53.6   Wrist Palm     Palm 1.9   60.6         Radial Anti Sensory (Base 1st Digit)  30.5C  Wrist 2.2   29.9   Wrist Base 1st Digit     Ulnar Anti Sensory (5th Digit)  30.3C  Wrist 3.4   54.9   Wrist 5th Digit 41     Motor Left/Right Comparison   Stim Site L Lat (ms) R Lat (ms) L-R Lat (ms) L Amp (mV)  R Amp (mV) L-R Amp (%) Site1 Site2 L Vel (m/s) R Vel (m/s) L-R Vel (m/s)  Median Motor (Abd Poll Brev)  30.6C  Wrist 3.1   7.2   Elbow Wrist 53    Elbow 6.5   6.3         Ulnar Motor (Abd Dig Min)  30.8C  Wrist 2.9   7.7   B Elbow Wrist 67    B Elbow 5.5   7.4   A Elbow B Elbow 75    A Elbow 6.7   7.4            Waveforms:

## 2017-03-25 ENCOUNTER — Other Ambulatory Visit: Payer: Self-pay | Admitting: Rheumatology

## 2017-03-26 NOTE — Telephone Encounter (Addendum)
Last visit: 01/10/17 Next visit: 09/02/17 Labs: 01/10/17 WNL   Okay to refill per Dr. Corliss Skains.

## 2017-05-19 ENCOUNTER — Other Ambulatory Visit: Payer: Self-pay | Admitting: *Deleted

## 2017-05-19 NOTE — Telephone Encounter (Signed)
Okay to give one-week supply. She will need labs for further refills.

## 2017-05-19 NOTE — Telephone Encounter (Signed)
OK to give 1 week supply. She needs labs for further refills

## 2017-05-19 NOTE — Telephone Encounter (Signed)
Last visit: 01/10/17 Next visit: 09/02/17 Labs: 01/10/17 WNL   Attempted to leave a message for patient  to advise her she is due for labs, but mailbox is full.  Okay to refill 30 day supply MTX?

## 2017-05-20 MED ORDER — METHOTREXATE 2.5 MG PO TABS: ORAL_TABLET | ORAL | 0 refills | Status: DC

## 2017-06-02 ENCOUNTER — Other Ambulatory Visit: Payer: Self-pay | Admitting: *Deleted

## 2017-06-02 MED ORDER — FOLIC ACID 1 MG PO TABS: 2.0000 mg | ORAL_TABLET | Freq: Every day | ORAL | 3 refills | Status: DC

## 2017-06-02 NOTE — Telephone Encounter (Signed)
Refill request received via fax  Last visit: 01/10/17 Next visit: 09/02/17  Okay to refill per Dr. Corliss Skains

## 2017-06-08 ENCOUNTER — Other Ambulatory Visit: Payer: Self-pay | Admitting: Rheumatology

## 2017-06-09 NOTE — Telephone Encounter (Signed)
Last visit: 01/10/17 Next visit: 09/02/17 Labs: 01/10/17 WNL

## 2017-06-12 ENCOUNTER — Other Ambulatory Visit: Payer: Self-pay

## 2017-06-12 DIAGNOSIS — Z79899 Other long term (current) drug therapy: Secondary | ICD-10-CM

## 2017-06-12 LAB — CBC WITH DIFFERENTIAL/PLATELET
Basophils Absolute: 62 cells/uL (ref 0–200)
Basophils Relative: 0.9 %
Eosinophils Absolute: 186 cells/uL (ref 15–500)
Eosinophils Relative: 2.7 %
HCT: 38.1 % (ref 35.0–45.0)
Hemoglobin: 12.9 g/dL (ref 11.7–15.5)
Lymphs Abs: 2753 cells/uL (ref 850–3900)
MCH: 31.2 pg (ref 27.0–33.0)
MCHC: 33.9 g/dL (ref 32.0–36.0)
MCV: 92 fL (ref 80.0–100.0)
MPV: 9.9 fL (ref 7.5–12.5)
Monocytes Relative: 9.5 %
Neutro Abs: 3243 cells/uL (ref 1500–7800)
Neutrophils Relative %: 47 %
Platelets: 284 10*3/uL (ref 140–400)
RBC: 4.14 10*6/uL (ref 3.80–5.10)
RDW: 13.5 % (ref 11.0–15.0)
Total Lymphocyte: 39.9 %
WBC mixed population: 656 cells/uL (ref 200–950)
WBC: 6.9 10*3/uL (ref 3.8–10.8)

## 2017-06-12 LAB — COMPLETE METABOLIC PANEL WITH GFR
AG Ratio: 1.3 (calc) (ref 1.0–2.5)
ALT: 10 U/L (ref 6–29)
AST: 14 U/L (ref 10–35)
Albumin: 3.7 g/dL (ref 3.6–5.1)
Alkaline phosphatase (APISO): 102 U/L (ref 33–130)
BUN: 11 mg/dL (ref 7–25)
CO2: 27 mmol/L (ref 20–32)
Calcium: 9 mg/dL (ref 8.6–10.4)
Chloride: 105 mmol/L (ref 98–110)
Creat: 0.65 mg/dL (ref 0.50–1.05)
GFR, Est African American: 117 mL/min/{1.73_m2} (ref >=60)
GFR, Est Non African American: 101 mL/min/{1.73_m2} (ref >=60)
Globulin: 2.8 g/dL (calc) (ref 1.9–3.7)
Glucose, Bld: 99 mg/dL (ref 65–139)
Potassium: 3.9 mmol/L (ref 3.5–5.3)
Sodium: 141 mmol/L (ref 135–146)
Total Bilirubin: 0.4 mg/dL (ref 0.2–1.2)
Total Protein: 6.5 g/dL (ref 6.1–8.1)

## 2017-06-22 ENCOUNTER — Other Ambulatory Visit: Payer: Self-pay | Admitting: Rheumatology

## 2017-06-23 NOTE — Telephone Encounter (Signed)
Last visit: 01/10/2017 Next visit: 09/02/2017 Labs: 06/12/2017 WNL   Okay to refill per Dr. Corliss Skains.

## 2017-06-26 ENCOUNTER — Ambulatory Visit: Payer: BC Managed Care – PPO | Admitting: Rheumatology

## 2017-08-21 ENCOUNTER — Other Ambulatory Visit: Payer: Self-pay | Admitting: Nurse Practitioner

## 2017-08-21 DIAGNOSIS — Z1231 Encounter for screening mammogram for malignant neoplasm of breast: Secondary | ICD-10-CM

## 2017-08-28 ENCOUNTER — Other Ambulatory Visit: Payer: Self-pay | Admitting: Nurse Practitioner

## 2017-08-28 ENCOUNTER — Other Ambulatory Visit (HOSPITAL_COMMUNITY)
Admission: RE | Admit: 2017-08-28 | Discharge: 2017-08-28 | Disposition: A | Discharge status: Home / Self Care | Payer: BC Managed Care – PPO | Source: Ambulatory Visit | Attending: Nurse Practitioner | Admitting: Nurse Practitioner

## 2017-08-28 DIAGNOSIS — Z01419 Encounter for gynecological examination (general) (routine) without abnormal findings: Secondary | ICD-10-CM | POA: Insufficient documentation

## 2017-09-01 NOTE — Progress Notes (Signed)
Office Visit Note  Patient: [name]             Date of Birth: 10-22-1960           MRN: [id number]             PCP: Patient, No Pcp Per Referring: No ref. provider found Visit Date: 09/02/2017 Occupation: @GUAROCC @    Subjective:  Left trochanteric bursitis    History of Present Illness: [name] is a 57 y.o. female with history of psoriatic arthritis.  She is on methotrexate 5 tablets weekly and folic acid 1 mg daily.  Patient states that her psoriatic arthritis has been very well controlled with methotrexate 5 tablets weekly.  She denies any recent flares.  She denies any joint pain or joint swelling at this time.  She states that she has very little morning stiffness in her joints.  She denies any Achilles tendinitis or plantar fasciitis.  She states that she has occasional discomfort in her right SI joint.  She denies any active psoriasis. She states that since her last visit she continues to have left leg numbness on the lateral aspect.  She states that the numbness is intermittent and worse if she sitting for prolonged periods of time.  She denies any lower back pain at this time.  She states she uses a heating pad if she experiences achiness on the lateral aspect of her thigh.  Activities of Daily Living:  Patient reports morning stiffness for 10-15 minutes.   Patient Reports nocturnal pain.  Difficulty dressing/grooming: Denies Difficulty climbing stairs: Denies Difficulty getting out of chair: Denies Difficulty using hands for taps, buttons, cutlery, and/or writing: Denies   Review of Systems  Constitutional: Negative for fatigue.  HENT: Negative for mouth sores, mouth dryness and nose dryness.   Eyes: Negative for pain, visual disturbance and dryness.  Respiratory: Negative for cough, hemoptysis, shortness of breath and difficulty breathing.   Cardiovascular: Negative for chest pain, palpitations, hypertension and swelling in legs/feet.    Gastrointestinal: Negative for abdominal pain, blood in stool, constipation and diarrhea.  Endocrine: Negative for increased urination.  Genitourinary: Negative for painful urination and pelvic pain.  Musculoskeletal: Positive for morning stiffness. Negative for arthralgias, joint pain, joint swelling, myalgias, muscle weakness, muscle tenderness and myalgias.  Skin: Negative for color change, pallor, rash, hair loss, nodules/bumps, skin tightness, ulcers and sensitivity to sunlight.  Allergic/Immunologic: Negative for susceptible to infections.  Neurological: Positive for numbness. Negative for dizziness, light-headedness, headaches, memory loss and weakness.  Hematological: Negative for bruising/bleeding tendency and swollen glands.  Psychiatric/Behavioral: Negative for depressed mood, confusion and sleep disturbance. The patient is not nervous/anxious.     PMFS History:  Patient Active Problem List   Diagnosis Date Noted  . Pain in both hands 08/27/2016  . High risk medication use 08/27/2016  . Psoriatic arthritis (HCC) 08/27/2016  . History of obesity 08/27/2016  . Other psoriasis 08/27/2016  . Methotrexate, long term, current use 06/22/2016    Past Medical History:  Diagnosis Date  . Migraines   . Osteoarthritis   . Psoriasis   . Psoriatic arthritis (HCC)     Family History  Problem Relation Age of Onset  . Cancer Mother        Lung  . Heart disease Father   . Healthy Son   . Healthy Son    Past Surgical History:  Procedure Laterality Date  . CESAREAN SECTION  1995, 1998   Social History  Social History Narrative  . Not on file     Objective: Vital Signs: BP 124/74 (BP Location: Left Arm, Patient Position: Sitting, Cuff Size: Normal)   Pulse 69   Resp 14   Ht 5\' 3"  (1.6 m)   Wt 188 lb (85.3 kg)   LMP 07/15/2013   BMI 33.30 kg/m    Physical Exam  Constitutional: She is oriented to person, place, and time. She appears well-developed and well-nourished.   HENT:  Head: Normocephalic and atraumatic.  Eyes: Conjunctivae and EOM are normal.  Neck: Normal range of motion.  Cardiovascular: Normal rate, regular rhythm, normal heart sounds and intact distal pulses.  Pulmonary/Chest: Effort normal and breath sounds normal.  Abdominal: Soft. Bowel sounds are normal.  Lymphadenopathy:    She has no cervical adenopathy.  Neurological: She is alert and oriented to person, place, and time.  Skin: Skin is warm and dry. Capillary refill takes less than 2 seconds.  Psychiatric: She has a normal mood and affect. Her behavior is normal.  Nursing note and vitals reviewed.    Musculoskeletal Exam: C-spine, thoracic spine, lumbar spine good range of motion with no midline spinal tenderness.  She has mild tenderness of right SI joint.  Shoulder joints, elbow joints, wrist joints, MCPs, PIPs, DIPs good range of motion no synovitis.  She has mild PIP and DIP synovial thickening.  No tenderness dactylitis noted.  Hip joints, knee joints, ankle joints, MTPs, PIPs, DIPs good range of motion with no synovitis.  No warmth or effusion of bilateral knees.  She has tenderness of the left trochanteric bursa.  No Achilles tendinitis or plantar fasciitis.  CDAI Exam: No CDAI exam completed.    Investigation: No additional findings. CBC Latest Ref Rng & Units 06/12/2017 01/10/2017 08/29/2016  WBC 3.8 - 10.8 Thousand/uL 6.9 7.3 8.9  Hemoglobin 11.7 - 15.5 g/dL 08/31/2016 33.5 45.6  Hematocrit 35.0 - 45.0 % 38.1 41.4 41.0  Platelets 140 - 400 Thousand/uL 284 296 305   CMP Latest Ref Rng & Units 06/12/2017 01/10/2017 08/29/2016  Glucose 65 - 139 mg/dL 99 08/31/2016) 98  BUN 7 - 25 mg/dL 11 11 11   Creatinine 0.50 - 1.05 mg/dL 389(H 7.34  Sodium 135 - 146 mmol/L 141 141 141  Potassium 3.5 - 5.3 mmol/L 3.9 5.1 4.8  Chloride 98 - 110 mmol/L 105 105 105  CO2 20 - 32 mmol/L 27 23 27   Calcium 8.6 - 10.4 mg/dL 9.0 9.2 9.2  Total Protein 6.1 - 8.1 g/dL 6.5 6.8 6.8  Total Bilirubin 0.2 -  1.2 mg/dL 0.4 0.5 0.5  Alkaline Phos 33 - 130 U/L - 108 99  AST 10 - 35 U/L 14 16 20   ALT 6 - 29 U/L 10 11 12     Imaging: No results found.  Speciality Comments: PLQ Eye Exam 05/07/17 WNL    Procedures:  No procedures performed Allergies: Penicillins   Assessment / Plan:     Visit Diagnoses: Psoriatic arthritis (HCC): She has no synovitis or dactylitis on exam.  She has no joint pain or joint swelling at this time.  She will occasionally experience right SI joint pain.  She has no Achilles tendinitis or plantar fasciitis.  She has no active psoriasis at this time.  She will continue on methotrexate 5 tablets once weekly and folic acid 1 mg daily.  A refill of methotrexate was sent to the pharmacy today.  Other psoriasis: She has no active psoriasis.  High risk medication use - Methotrexate  5 tablets by mouth every week, folic acid 1 mg by mouth daily -CBC and CMP will be drawn today.  She will return in July and every 3 months to monitor for drug toxicity.  Plan: CBC with Differential/Platelet, COMPLETE METABOLIC PANEL WITH GFR  History of obesity  Left hand paresthesia: Resolved.  Trochanteric bursitis, left hip: She has tenderness of left trochanteric bursa.  She was given a handout of exercises that she can perform at home.  She declined a cortisone injection today in the office.   Orders: Orders Placed This Encounter  Procedures  . CBC with Differential/Platelet  . COMPLETE METABOLIC PANEL WITH GFR   Meds ordered this encounter  Medications  . methotrexate (RHEUMATREX) 2.5 MG tablet    Sig: Caution:Chemotherapy. Protect from light.    Dispense:  60 tablet    Refill:  0    Face-to-face time spent with patient was 30 minutes. >50% of time was spent in counseling and coordination of care.  Follow-Up Instructions: Return in about 5 months (around 02/02/2018) for Psoriatic arthritis.   Gearldine Bienenstock, PA-C   I examined and evaluated the patient with Sherron Ales PA.  Her exam was consistent with left trochanteric bursitis. I discussed exercises with her . If her symptoms persist then we can refer her to physical therapy.The plan of care was discussed as noted above.  [name], MD  Note - This record has been created using Animal nutritionist.  Chart creation errors have been sought, but may not always  have been located. Such creation errors do not reflect on  the standard of medical care.

## 2017-09-02 ENCOUNTER — Ambulatory Visit: Payer: BC Managed Care – PPO | Admitting: Rheumatology

## 2017-09-02 ENCOUNTER — Encounter: Payer: Self-pay | Admitting: Rheumatology

## 2017-09-02 ENCOUNTER — Ambulatory Visit: Payer: BC Managed Care – PPO | Admitting: Physician Assistant

## 2017-09-02 VITALS — BP 124/74 | HR 69 | Resp 14 | Ht 63.0 in | Wt 188.0 lb

## 2017-09-02 DIAGNOSIS — Z8639 Personal history of other endocrine, nutritional and metabolic disease: Secondary | ICD-10-CM | POA: Diagnosis not present

## 2017-09-02 DIAGNOSIS — M7062 Trochanteric bursitis, left hip: Secondary | ICD-10-CM | POA: Diagnosis not present

## 2017-09-02 DIAGNOSIS — Z79899 Other long term (current) drug therapy: Secondary | ICD-10-CM | POA: Diagnosis not present

## 2017-09-02 DIAGNOSIS — L408 Other psoriasis: Secondary | ICD-10-CM

## 2017-09-02 DIAGNOSIS — L4050 Arthropathic psoriasis, unspecified: Secondary | ICD-10-CM

## 2017-09-02 DIAGNOSIS — R202 Paresthesia of skin: Secondary | ICD-10-CM | POA: Diagnosis not present

## 2017-09-02 LAB — CBC WITH DIFFERENTIAL/PLATELET
Basophils Absolute: 71 cells/uL (ref 0–200)
Basophils Relative: 0.7 %
Eosinophils Absolute: 173 cells/uL (ref 15–500)
Eosinophils Relative: 1.7 %
HCT: 39.8 % (ref 35.0–45.0)
Hemoglobin: 13.7 g/dL (ref 11.7–15.5)
Lymphs Abs: 2264 cells/uL (ref 850–3900)
MCH: 31.9 pg (ref 27.0–33.0)
MCHC: 34.4 g/dL (ref 32.0–36.0)
MCV: 92.8 fL (ref 80.0–100.0)
MPV: 10 fL (ref 7.5–12.5)
Monocytes Relative: 7.1 %
Neutro Abs: 6967 cells/uL (ref 1500–7800)
Neutrophils Relative %: 68.3 %
Platelets: 340 10*3/uL (ref 140–400)
RBC: 4.29 10*6/uL (ref 3.80–5.10)
RDW: 14.1 % (ref 11.0–15.0)
Total Lymphocyte: 22.2 %
WBC mixed population: 724 cells/uL (ref 200–950)
WBC: 10.2 10*3/uL (ref 3.8–10.8)

## 2017-09-02 LAB — COMPLETE METABOLIC PANEL WITH GFR
AG Ratio: 1.3 (calc) (ref 1.0–2.5)
ALT: 11 U/L (ref 6–29)
AST: 16 U/L (ref 10–35)
Albumin: 4 g/dL (ref 3.6–5.1)
Alkaline phosphatase (APISO): 115 U/L (ref 33–130)
BUN: 8 mg/dL (ref 7–25)
CO2: 29 mmol/L (ref 20–32)
Calcium: 9.5 mg/dL (ref 8.6–10.4)
Chloride: 106 mmol/L (ref 98–110)
Creat: 0.67 mg/dL (ref 0.50–1.05)
GFR, Est African American: 115 mL/min/{1.73_m2} (ref >=60)
GFR, Est Non African American: 100 mL/min/{1.73_m2} (ref >=60)
Globulin: 3 g/dL (calc) (ref 1.9–3.7)
Glucose, Bld: 103 mg/dL — ABNORMAL HIGH (ref 65–99)
Potassium: 4.1 mmol/L (ref 3.5–5.3)
Sodium: 142 mmol/L (ref 135–146)
Total Bilirubin: 0.6 mg/dL (ref 0.2–1.2)
Total Protein: 7 g/dL (ref 6.1–8.1)

## 2017-09-02 MED ORDER — METHOTREXATE 2.5 MG PO TABS: ORAL_TABLET | ORAL | 0 refills | Status: DC

## 2017-09-02 NOTE — Patient Instructions (Addendum)
Trochanteric Bursitis Rehab Ask your health care provider which exercises are safe for you. Do exercises exactly as told by your health care provider and adjust them as directed. It is normal to feel mild stretching, pulling, tightness, or discomfort as you do these exercises, but you should stop right away if you feel sudden pain or your pain gets worse.Do not begin these exercises until told by your health care provider. Stretching exercises These exercises warm up your muscles and joints and improve the movement and flexibility of your hip. These exercises also help to relieve pain and stiffness. Exercise A: Iliotibial band stretch  1. Lie on your side with your left / right leg in the top position. 2. Bend your left / right knee and grab your ankle. 3. Slowly bring your knee back so your thigh is behind your body. 4. Slowly lower your knee toward the floor until you feel a gentle stretch on the outside of your left / right thigh. If you do not feel a stretch and your knee will not fall farther, place the heel of your other foot on top of your outer knee and pull your thigh down farther. 5. Hold this position for __________ seconds. 6. Slowly return to the starting position. Repeat __________ times. Complete this exercise __________ times a day. Strengthening exercises These exercises build strength and endurance in your hip and pelvis. Endurance is the ability to use your muscles for a long time, even after they get tired. Exercise B: Bridge ( hip extensors) 1. Lie on your back on a firm surface with your knees bent and your feet flat on the floor. 2. Tighten your buttocks muscles and lift your buttocks off the floor until your trunk is level with your thighs. You should feel the muscles working in your buttocks and the back of your thighs. If this exercise is too easy, try doing it with your arms crossed over your chest. 3. Hold this position for __________ seconds. 4. Slowly return to the  starting position. 5. Let your muscles relax completely between repetitions. Repeat __________ times. Complete this exercise __________ times a day. Exercise C: Squats ( knee extensors and  quadriceps) 1. Stand in front of a table, with your feet and knees pointing straight ahead. You may rest your hands on the table for balance but not for support. 2. Slowly bend your knees and lower your hips like you are going to sit in a chair. ? Keep your weight over your heels, not over your toes. ? Keep your lower legs upright so they are parallel with the table legs. ? Do not let your hips go lower than your knees. ? Do not bend lower than told by your health care provider. ? If your hip pain increases, do not bend as low. 3. Hold this position for __________ seconds. 4. Slowly push with your legs to return to standing. Do not use your hands to pull yourself to standing. Repeat __________ times. Complete this exercise __________ times a day. Exercise D: Hip hike 1. Stand sideways on a bottom step. Stand on your left / right leg with your other foot unsupported next to the step. You can hold onto the railing or wall if needed for balance. 2. Keeping your knees straight and your torso square, lift your left / right hip up toward the ceiling. 3. Hold this position for __________ seconds. 4. Slowly let your left / right hip lower toward the floor, past the starting position. Your foot   should get closer to the floor. Do not lean or bend your knees. Repeat __________ times. Complete this exercise __________ times a day. Exercise E: Single leg stand 1. Stand near a counter or door frame that you can hold onto for balance as needed. It is helpful to stand in front of a mirror for this exercise so you can watch your hip. 2. Squeeze your left / right buttock muscles then lift up your other foot. Do not let your left / right hip push out to the side. 3. Hold this position for __________ seconds. Repeat  __________ times. Complete this exercise __________ times a day. This information is not intended to replace advice given to you by your health care provider. Make sure you discuss any questions you have with your health care provider. Document Released: 06/06/2004 Document Revised: 01/04/2016 Document Reviewed: 04/14/2015 Elsevier Interactive Patient Education  2018 [employer] Trochanteric Bursitis Trochanteric bursitis is a condition that causes hip pain. Trochanteric bursitis happens when fluid-filled sacs (bursae) in the hip get irritated. Normally these sacs absorb shock and help strong bands of tissue (tendons) in your hip glide smoothly over each other and over your hip bones. What are the causes? This condition results from increased friction between the hip bones and the tendons that go over them. This condition can happen if you:  Have weak hips.  Use your hip muscles too much (overuse).  Get hit in the hip.  What increases the risk? This condition is more likely to develop in:  Women.  Adults who are middle-aged or older.  People with arthritis or a spinal condition.  People with weak buttocks muscles (gluteal muscles).  People who have one leg that is shorter than the other.  People who participate in certain kinds of athletic activities, such as: ? Running sports, especially long-distance running. ? Contact sports, like football or martial arts. ? Sports in which falls may occur, like skiing.  What are the signs or symptoms? The main symptom of this condition is pain and tenderness over the point of your hip. The pain may be:  Sharp and intense.  Dull and achy.  Felt on the outside of your thigh.  It may increase when you:  Lie on your side.  Walk or run.  Go up on stairs.  Sit.  Stand up after sitting.  Stand for long periods of time.  How is this diagnosed? This condition may be diagnosed based on:  Your symptoms.  Your medical  history.  A physical exam.  Imaging tests, such as: ? X-rays to check your bones. ? An MRI or ultrasound to check your tendons and muscles.  During your physical exam, your health care provider will check the movement and strength of your hip. He or she may press on the point of your hip to check for pain. How is this treated? This condition may be treated by:  Resting.  Reducing your activity.  Avoiding activities that cause pain.  Using crutches, a cane, or a walker to decrease the strain on your hip.  Taking medicine to help with swelling.  Having medicine injected into the bursae to help with swelling.  Using ice, heat, and massage therapy for pain relief.  Physical therapy exercises for strength and flexibility.  Surgery (rare).  Follow these instructions at home: Activity  Rest.  Avoid activities that cause pain.  Return to your normal activities as told by your health care provider. Ask your health care provider what activities   are safe for you. Managing pain, stiffness, and swelling  Take over-the-counter and prescription medicines only as told by your health care provider.  If directed, apply heat to the injured area as told by your health care provider. ? Place a towel between your skin and the heat source. ? Leave the heat on for 20-30 minutes. ? Remove the heat if your skin turns bright red. This is especially important if you are unable to feel pain, heat, or cold. You may have a greater risk of getting burned.  If directed, apply ice to the injured area: ? Put ice in a plastic bag. ? Place a towel between your skin and the bag. ? Leave the ice on for 20 minutes, 2-3 times a day. General instructions  If the affected leg is one that you use for driving, ask your health care provider when it is safe to drive.  Use crutches, a cane, or a walker as told by your health care provider.  If one of your legs is shorter than the other, get fitted for a  shoe insert.  Lose weight if you are overweight. How is this prevented?  Wear supportive footwear that is appropriate for your sport.  If you have hip pain, start any new exercise or sport slowly.  Maintain physical fitness, including: ? Strength. ? Flexibility. Contact a health care provider if:  Your pain does not improve with 2-4 weeks. Get help right away if:  You develop severe pain.  You have a fever.  You develop increased redness over your hip.  You have a change in your bowel function or bladder function.  You cannot control the muscles in your feet. This information is not intended to replace advice given to you by your health care provider. Make sure you discuss any questions you have with your health care provider. Document Released: 06/06/2004 Document Revised: 01/03/2016 Document Reviewed: 04/14/2015 Elsevier Interactive Patient Education  2018 [employer] We placed an order today for your standing lab work.    Please come back and get your standing labs in July and every 3 months  We have open lab Monday through Friday from 8:30-11:30 AM and 1:30-4:00 PM  at the office of Dr. [name].   You may experience shorter wait times on Monday and Friday afternoons. The office is located at [address], [address], [place], [place] [address] No appointment is necessary.   Labs are drawn by [facility].  You may receive a bill from [facility] for your lab work. If you have any questions regarding directions or hours of operation,  please call [phone].

## 2017-09-03 LAB — CYTOLOGY - PAP
Diagnosis: NEGATIVE
HPV: NOT DETECTED

## 2017-09-03 NOTE — Progress Notes (Signed)
Glucose borderline elevated. All other lab values are WNL.

## 2017-09-11 ENCOUNTER — Ambulatory Visit
Admission: RE | Admit: 2017-09-11 | Discharge: 2017-09-11 | Disposition: A | Discharge status: Home / Self Care | Payer: BC Managed Care – PPO | Source: Ambulatory Visit | Attending: Nurse Practitioner | Admitting: Nurse Practitioner

## 2017-09-11 DIAGNOSIS — Z1231 Encounter for screening mammogram for malignant neoplasm of breast: Secondary | ICD-10-CM

## 2017-09-16 ENCOUNTER — Other Ambulatory Visit: Payer: Self-pay | Admitting: Rheumatology

## 2017-11-21 ENCOUNTER — Other Ambulatory Visit: Payer: Self-pay | Admitting: Physician Assistant

## 2017-11-21 NOTE — Telephone Encounter (Signed)
Last Visit: 09/02/17 Next visit: 02/05/18 Labs: 09/02/17 Glucose borderline elevated. All other lab values are WNL.   Okay to refill per Dr. Corliss Skains

## 2017-12-10 ENCOUNTER — Encounter: Payer: Self-pay | Admitting: Rheumatology

## 2018-01-23 NOTE — Progress Notes (Signed)
Office Visit Note  Patient: [name]             Date of Birth: 10-09-1960           MRN: [id number]             PCP: Patient, No Pcp Per Referring: No ref. provider found Visit Date: 02/05/2018 Occupation: @GUAROCC @  Subjective:  Medication monitoring   History of Present Illness: [name] is a 57 y.o. female with history of psoriatic arthritis. She is on MTX 5 tablets by mouth once a week and folic acid 1 mg daily. She denies any recent flares. She states that she has some stiffness lasting about 10 minutes first thing in the morning but denies any other joint pain or joint swelling at this point.  She denies any plantar fasciitis or Achilles tendinitis.  She denies any SI joint pain.  She denies any active psoriasis at this time.  She continues to have left trochanteric bursitis intermittently.   Activities of Daily Living:  Patient reports morning stiffness for 10-15 minutes.   Patient Denies nocturnal pain.  Difficulty dressing/grooming: Denies Difficulty climbing stairs: Denies Difficulty getting out of chair: Denies Difficulty using hands for taps, buttons, cutlery, and/or writing: Denies  Review of Systems  Constitutional: Negative for fatigue.  HENT: Positive for mouth dryness. Negative for mouth sores and nose dryness.   Eyes: Negative for pain, visual disturbance and dryness.  Respiratory: Negative for cough, hemoptysis, shortness of breath and difficulty breathing.   Cardiovascular: Negative for chest pain, palpitations, hypertension and swelling in legs/feet.  Gastrointestinal: Negative for blood in stool, constipation and diarrhea.  Endocrine: Negative for increased urination.  Genitourinary: Negative for difficulty urinating and painful urination.  Musculoskeletal: Positive for arthralgias, joint pain and morning stiffness. Negative for joint swelling, myalgias, muscle weakness, muscle tenderness and myalgias.  Skin: Negative for color change,  pallor, rash, hair loss, nodules/bumps, skin tightness, ulcers and sensitivity to sunlight.  Allergic/Immunologic: Negative for susceptible to infections.  Neurological: Negative for dizziness, numbness, headaches and weakness.  Hematological: Negative for swollen glands.  Psychiatric/Behavioral: Negative for depressed mood and sleep disturbance. The patient is not nervous/anxious.     PMFS History:  Patient Active Problem List   Diagnosis Date Noted  . Pain in both hands 08/27/2016  . High risk medication use 08/27/2016  . Psoriatic arthritis (HCC) 08/27/2016  . History of obesity 08/27/2016  . Other psoriasis 08/27/2016  . Methotrexate, long term, current use 06/22/2016    Past Medical History:  Diagnosis Date  . Migraines   . Osteoarthritis   . Psoriasis   . Psoriatic arthritis (HCC)     Family History  Problem Relation Age of Onset  . Cancer Mother        Lung  . Heart disease Father   . Healthy Son   . Healthy Son    Past Surgical History:  Procedure Laterality Date  . CESAREAN SECTION  1995, 1998   Social History   Social History Narrative  . Not on file    Objective: Vital Signs: BP 106/64 (BP Location: Left Arm, Patient Position: Sitting, Cuff Size: Normal)   Pulse (!) 59   Ht 5\' 3"  (1.6 m)   Wt 184 lb 6.4 oz (83.6 kg)   LMP 07/15/2013   BMI 32.66 kg/m    Physical Exam  Constitutional: She is oriented to person, place, and time. She appears well-developed and well-nourished.  HENT:  Head: Normocephalic and atraumatic.  Eyes: Conjunctivae and EOM are normal.  Neck: Normal range of motion.  Cardiovascular: Normal rate, regular rhythm, normal heart sounds and intact distal pulses.  Pulmonary/Chest: Effort normal and breath sounds normal.  Abdominal: Soft. Bowel sounds are normal.  Lymphadenopathy:    She has no cervical adenopathy.  Neurological: She is alert and oriented to person, place, and time.  Skin: Skin is warm and dry. Capillary refill  takes less than 2 seconds.  Psychiatric: She has a normal mood and affect. Her behavior is normal.  Nursing note and vitals reviewed.    Musculoskeletal Exam: C-spine, thoracic spine, lumbar spine good range of motion.  No midline spinal tenderness.  No SI joint tenderness.  Shoulder joints, elbow joints, wrist joints, MCPs, PIPs, DIPs good range of motion no synovitis.  She has PIP and DIP synovial thickening consistent with osteoarthritis of bilateral hands.  She is complete fist formation bilaterally.  Hip joints, knee joints, ankle joints, MTPs, PIPs, DIPs good range of motion no synovitis.  No warmth or effusion of bilateral knee joints.  She has tenderness of the left trochanteric bursa on exam today.  No Achilles tendinitis or plantar fasciitis.  She has no active psoriasis at this time.  CDAI Exam: CDAI Score: Not documented Patient Global Assessment: Not documented; Provider Global Assessment: Not documented Swollen: Not documented; Tender: Not documented Joint Exam   Not documented   There is currently no information documented on the homunculus. Go to the Rheumatology activity and complete the homunculus joint exam.  Investigation: No additional findings.  Imaging: No results found.  Recent Labs: Lab Results  Component Value Date   WBC 10.2 09/02/2017   HGB 13.7 09/02/2017   PLT 340 09/02/2017   NA 142 09/02/2017   K 4.1 09/02/2017   CL 106 09/02/2017   CO2 29 09/02/2017   GLUCOSE 103 (H) 09/02/2017   BUN 8 09/02/2017   CREATININE 0.67 09/02/2017   BILITOT 0.6 09/02/2017   ALKPHOS 108 01/10/2017   AST 16 09/02/2017   ALT 11 09/02/2017   PROT 7.0 09/02/2017   ALBUMIN 3.9 01/10/2017   CALCIUM 9.5 09/02/2017   GFRAA 115 09/02/2017    Speciality Comments: PLQ Eye Exam 05/07/17 WNL  Procedures:  No procedures performed Allergies: Penicillins   Assessment / Plan:     Visit Diagnoses: Psoriatic arthritis (HCC): She has no synovitis or dactylitis.  She has not  had any recent psoriatic arthritis flares.  She has no Achilles tendinitis or plantar fasciitis.  She has no SI joint tenderness on exam.  She is clinically doing well on methotrexate 5 tablets by mouth once weekly and folic acid 1 mg daily.  A refill of methotrexate was sent to the pharmacy today.  She was advised to notify us if she develops increased joint pain or joint swelling.  She will follow-up in the office in 5 months.  Other psoriasis: She has no active psoriasis at this time.  High risk medication use - Methotrexate 5 tablets by mouth every week, folic acid 1 mg by mouth daily.  CBC and CMP will be drawn today to monitor for drug toxicity.  She will return in December and every 3 months for lab work.- Plan: CBC with Differential/Platelet, COMPLETE METABOLIC PANEL WITH GFR  Trochanteric bursitis, left hip: She continues to have tenderness of the left trochanteric bursa on exam.  She has intermittent bursitis.  She perform stretching exercises on a regular basis.  Left hand paresthesia: Resolved.  History of  obesity   Orders: Orders Placed This Encounter  Procedures  . CBC with Differential/Platelet  . COMPLETE METABOLIC PANEL WITH GFR   Meds ordered this encounter  Medications  . methotrexate (RHEUMATREX) 2.5 MG tablet    Sig: CAUTION:CHEMOTHERAPY. PROTECT FROM LIGHT. TAKE 5 TABS PER WEEK    Dispense:  60 tablet    Refill:  0     Follow-Up Instructions: Return in about 5 months (around 07/08/2018) for Psoriatic arthritis.   Gearldine Bienenstock, PA-C   I examined and evaluated the patient with Sherron Ales PA.  Patient had no synovitis on my examination.  She is doing well on methotrexate.  The plan of care was discussed as noted above.  [name], MD  Note - This record has been created using Animal nutritionist.  Chart creation errors have been sought, but may not always  have been located. Such creation errors do not reflect on  the standard of medical care.

## 2018-02-05 ENCOUNTER — Encounter: Payer: Self-pay | Admitting: Rheumatology

## 2018-02-05 ENCOUNTER — Encounter (INDEPENDENT_AMBULATORY_CARE_PROVIDER_SITE_OTHER): Payer: Self-pay

## 2018-02-05 ENCOUNTER — Ambulatory Visit: Payer: BC Managed Care – PPO | Admitting: Rheumatology

## 2018-02-05 VITALS — BP 106/64 | HR 59 | Ht 63.0 in | Wt 184.4 lb

## 2018-02-05 DIAGNOSIS — L408 Other psoriasis: Secondary | ICD-10-CM | POA: Diagnosis not present

## 2018-02-05 DIAGNOSIS — R202 Paresthesia of skin: Secondary | ICD-10-CM

## 2018-02-05 DIAGNOSIS — L4050 Arthropathic psoriasis, unspecified: Secondary | ICD-10-CM | POA: Diagnosis not present

## 2018-02-05 DIAGNOSIS — Z8639 Personal history of other endocrine, nutritional and metabolic disease: Secondary | ICD-10-CM

## 2018-02-05 DIAGNOSIS — M7062 Trochanteric bursitis, left hip: Secondary | ICD-10-CM

## 2018-02-05 DIAGNOSIS — Z79899 Other long term (current) drug therapy: Secondary | ICD-10-CM

## 2018-02-05 LAB — COMPLETE METABOLIC PANEL WITH GFR
AG Ratio: 1.3 (calc) (ref 1.0–2.5)
ALT: 10 U/L (ref 6–29)
AST: 18 U/L (ref 10–35)
Albumin: 4 g/dL (ref 3.6–5.1)
Alkaline phosphatase (APISO): 106 U/L (ref 33–130)
BUN: 17 mg/dL (ref 7–25)
CO2: 30 mmol/L (ref 20–32)
Calcium: 9.7 mg/dL (ref 8.6–10.4)
Chloride: 103 mmol/L (ref 98–110)
Creat: 0.82 mg/dL (ref 0.50–1.05)
GFR, Est African American: 94 mL/min/{1.73_m2} (ref >=60)
GFR, Est Non African American: 81 mL/min/{1.73_m2} (ref >=60)
Globulin: 3 g/dL (calc) (ref 1.9–3.7)
Glucose, Bld: 95 mg/dL (ref 65–99)
Potassium: 4.8 mmol/L (ref 3.5–5.3)
Sodium: 140 mmol/L (ref 135–146)
Total Bilirubin: 0.5 mg/dL (ref 0.2–1.2)
Total Protein: 7 g/dL (ref 6.1–8.1)

## 2018-02-05 LAB — CBC WITH DIFFERENTIAL/PLATELET
Basophils Absolute: 76 cells/uL (ref 0–200)
Basophils Relative: 0.9 %
Eosinophils Absolute: 168 cells/uL (ref 15–500)
Eosinophils Relative: 2 %
HCT: 40.3 % (ref 35.0–45.0)
Hemoglobin: 13.7 g/dL (ref 11.7–15.5)
Lymphs Abs: 2696 cells/uL (ref 850–3900)
MCH: 32.2 pg (ref 27.0–33.0)
MCHC: 34 g/dL (ref 32.0–36.0)
MCV: 94.8 fL (ref 80.0–100.0)
MPV: 10.3 fL (ref 7.5–12.5)
Monocytes Relative: 9.7 %
Neutro Abs: 4645 cells/uL (ref 1500–7800)
Neutrophils Relative %: 55.3 %
Platelets: 300 10*3/uL (ref 140–400)
RBC: 4.25 10*6/uL (ref 3.80–5.10)
RDW: 13.8 % (ref 11.0–15.0)
Total Lymphocyte: 32.1 %
WBC mixed population: 815 cells/uL (ref 200–950)
WBC: 8.4 10*3/uL (ref 3.8–10.8)

## 2018-02-05 MED ORDER — METHOTREXATE 2.5 MG PO TABS: ORAL_TABLET | ORAL | 0 refills | Status: DC

## 2018-02-05 NOTE — Patient Instructions (Signed)
Standing Labs We placed an order today for your standing lab work.    Please come back and get your standing labs in December and every 3 months   We have open lab Monday through Friday from 8:30-11:30 AM and 1:30-4:00 PM  at the office of Dr. Shaili Deveshwar.   You may experience shorter wait times on Monday and Friday afternoons. The office is located at [address], [address], [place], [place] [address] No appointment is necessary.   Labs are drawn by [facility].  You may receive a bill from [facility] for your lab work. If you have any questions regarding directions or hours of operation,  please call [phone].   Just as a reminder please drink plenty of water prior to coming for your lab work. Thanks!   

## 2018-02-06 NOTE — Progress Notes (Signed)
Labs are WNL.

## 2018-05-13 ENCOUNTER — Other Ambulatory Visit: Payer: Self-pay | Admitting: Physician Assistant

## 2018-05-14 NOTE — Telephone Encounter (Addendum)
Last Visit: 02/05/18 Next visit: 07/09/18 Labs: 02/05/18 WNL  Patient advised she is due to update labs. Patient states she will update this week.   Okay to refill 30 day supply per Dr. Corliss Skains

## 2018-05-14 NOTE — Addendum Note (Signed)
Addended by: Henriette Combs on: 05/14/2018 09:02 AM   Modules accepted: Orders

## 2018-05-15 ENCOUNTER — Other Ambulatory Visit: Payer: Self-pay

## 2018-05-15 DIAGNOSIS — Z79899 Other long term (current) drug therapy: Secondary | ICD-10-CM

## 2018-05-15 LAB — COMPLETE METABOLIC PANEL WITH GFR
AG Ratio: 1.4 (calc) (ref 1.0–2.5)
ALT: 10 U/L (ref 6–29)
AST: 15 U/L (ref 10–35)
Albumin: 3.7 g/dL (ref 3.6–5.1)
Alkaline phosphatase (APISO): 99 U/L (ref 33–130)
BUN: 9 mg/dL (ref 7–25)
CO2: 28 mmol/L (ref 20–32)
Calcium: 9.1 mg/dL (ref 8.6–10.4)
Chloride: 106 mmol/L (ref 98–110)
Creat: 0.77 mg/dL (ref 0.50–1.05)
GFR, Est African American: 101 mL/min/{1.73_m2} (ref >=60)
GFR, Est Non African American: 87 mL/min/{1.73_m2} (ref >=60)
Globulin: 2.6 g/dL (calc) (ref 1.9–3.7)
Glucose, Bld: 123 mg/dL — ABNORMAL HIGH (ref 65–99)
Potassium: 5.2 mmol/L (ref 3.5–5.3)
Sodium: 141 mmol/L (ref 135–146)
Total Bilirubin: 0.4 mg/dL (ref 0.2–1.2)
Total Protein: 6.3 g/dL (ref 6.1–8.1)

## 2018-05-15 LAB — CBC WITH DIFFERENTIAL/PLATELET
Absolute Monocytes: 648 cells/uL (ref 200–950)
Basophils Absolute: 80 cells/uL (ref 0–200)
Basophils Relative: 1 %
Eosinophils Absolute: 168 cells/uL (ref 15–500)
Eosinophils Relative: 2.1 %
HCT: 39.2 % (ref 35.0–45.0)
Hemoglobin: 13.1 g/dL (ref 11.7–15.5)
Lymphs Abs: 2216 cells/uL (ref 850–3900)
MCH: 32 pg (ref 27.0–33.0)
MCHC: 33.4 g/dL (ref 32.0–36.0)
MCV: 95.8 fL (ref 80.0–100.0)
MPV: 10.4 fL (ref 7.5–12.5)
Monocytes Relative: 8.1 %
Neutro Abs: 4888 cells/uL (ref 1500–7800)
Neutrophils Relative %: 61.1 %
Platelets: 298 10*3/uL (ref 140–400)
RBC: 4.09 10*6/uL (ref 3.80–5.10)
RDW: 13.5 % (ref 11.0–15.0)
Total Lymphocyte: 27.7 %
WBC: 8 10*3/uL (ref 3.8–10.8)

## 2018-06-12 ENCOUNTER — Other Ambulatory Visit: Payer: Self-pay | Admitting: Physician Assistant

## 2018-06-12 NOTE — Telephone Encounter (Signed)
Last Visit: 02/05/18 Next visit: 07/09/18 Labs: 05/15/18 Glucose is 123. All other labs are WNL.  Okay to refill per Dr. Corliss Skains

## 2018-06-26 NOTE — Progress Notes (Signed)
Office Visit Note  Patient: [name]             Date of Birth: 05/15/1960           MRN: [id number]             PCP: Patient, No Pcp Per Referring: No ref. provider found Visit Date: 07/09/2018 Occupation: @GUAROCC @  Subjective:  Morning stiffness    History of Present Illness: [name] is a 58 y.o. female with history of psoriatic arthritis.  She takes methotrexate 5 tablets by mouth once weekly and folic acid 1 mg by mouth daily.  She has not had any recent flares.  She has no joint pain or joint swelling at this time.  She reports that she has morning stiffness lasting about 10 minutes which has not worsened.  She denies any SI joint pain.  She denies any Achilles tendinitis or plantar fasciitis.  She continues to have left trochanter bursitis and perform stretching exercises on a regular basis.  She denies any recent infections.  She denies any concerns at this time.    Activities of Daily Living:  Patient reports morning stiffness for 10 minutes.   Patient Reports nocturnal pain.  Difficulty dressing/grooming: Denies Difficulty climbing stairs: Denies Difficulty getting out of chair: Denies Difficulty using hands for taps, buttons, cutlery, and/or writing: Denies  Review of Systems  Constitutional: Negative for fatigue.  HENT: Positive for mouth dryness. Negative for mouth sores and nose dryness.   Eyes: Negative for pain, itching, visual disturbance and dryness.  Respiratory: Negative for cough, hemoptysis, shortness of breath, wheezing and difficulty breathing.   Cardiovascular: Negative for chest pain, palpitations, hypertension and swelling in legs/feet.  Gastrointestinal: Negative for abdominal pain, blood in stool, constipation, diarrhea, nausea and vomiting.  Endocrine: Negative for increased urination.  Genitourinary: Negative for painful urination.  Musculoskeletal: Positive for morning stiffness. Negative for arthralgias, joint pain, joint  swelling, myalgias, muscle weakness, muscle tenderness and myalgias.  Skin: Positive for rash. Negative for color change, pallor, hair loss, nodules/bumps, skin tightness, ulcers and sensitivity to sunlight.  Allergic/Immunologic: Negative for susceptible to infections.  Neurological: Negative for dizziness, numbness, headaches, memory loss and weakness.  Hematological: Negative for bruising/bleeding tendency and swollen glands.  Psychiatric/Behavioral: Negative for depressed mood, confusion and sleep disturbance. The patient is not nervous/anxious.     PMFS History:  Patient Active Problem List   Diagnosis Date Noted  . Pain in both hands 08/27/2016  . High risk medication use 08/27/2016  . Psoriatic arthritis (HCC) 08/27/2016  . History of obesity 08/27/2016  . Other psoriasis 08/27/2016  . Methotrexate, long term, current use 06/22/2016    Past Medical History:  Diagnosis Date  . Migraines   . Osteoarthritis   . Psoriasis   . Psoriatic arthritis (HCC)     Family History  Problem Relation Age of Onset  . Cancer Mother        Lung  . Heart disease Father   . Healthy Son   . Healthy Son    Past Surgical History:  Procedure Laterality Date  . CESAREAN SECTION  1995, 1998   Social History   Social History Narrative  . Not on file    There is no immunization history on file for this patient.   Objective: Vital Signs: BP 114/67 (BP Location: Left Arm, Patient Position: Sitting, Cuff Size: Normal)   Pulse 60   Resp 13   Ht 5\' 3"  (1.6 m)  Wt 188 lb (85.3 kg)   LMP 07/15/2013   BMI 33.30 kg/m    Physical Exam Vitals signs and nursing note reviewed.  Constitutional:      Appearance: She is well-developed.  HENT:     Head: Normocephalic and atraumatic.  Eyes:     Conjunctiva/sclera: Conjunctivae normal.  Neck:     Musculoskeletal: Normal range of motion.  Cardiovascular:     Rate and Rhythm: Normal rate and regular rhythm.     Heart sounds: Normal heart  sounds.  Pulmonary:     Effort: Pulmonary effort is normal.     Breath sounds: Normal breath sounds.  Abdominal:     General: Bowel sounds are normal.     Palpations: Abdomen is soft.  Lymphadenopathy:     Cervical: No cervical adenopathy.  Skin:    General: Skin is warm and dry.     Capillary Refill: Capillary refill takes less than 2 seconds.  Neurological:     Mental Status: She is alert and oriented to person, place, and time.  Psychiatric:        Behavior: Behavior normal.      Musculoskeletal Exam: C-spine, thoracic spine, lumbar spine good range of motion.  No midline spinal tenderness.  No SI joint tenderness.  Shoulder joints, elbow joints, wrist joints, MCPs, PIPs, DIPs good range of motion no synovitis.  She has complete fist formation bilaterally.  She has PIP and DIP synovial thickening consistent with osteoarthritis of bilateral hands.  Hip joints, knee joints, ankle joints, MCPs, PIPs, DIPs good range of motion with no synovitis.  No warmth or effusion of bilateral knee joints.  No tenderness or swelling of ankle joints.  No Achilles tendinitis or plantar fasciitis.  She has tenderness over the left trochanteric bursa.  CDAI Exam: CDAI Score: 0.6  Patient Global Assessment: 3 (mm); Provider Global Assessment: 3 (mm) Swollen: 0 ; Tender: 0  Joint Exam   Not documented   There is currently no information documented on the homunculus. Go to the Rheumatology activity and complete the homunculus joint exam.  Investigation: No additional findings.  Imaging: No results found.  Recent Labs: Lab Results  Component Value Date   WBC 8.0 05/15/2018   HGB 13.1 05/15/2018   PLT 298 05/15/2018   NA 141 05/15/2018   K 5.2 05/15/2018   CL 106 05/15/2018   CO2 28 05/15/2018   GLUCOSE 123 (H) 05/15/2018   BUN 9 05/15/2018   CREATININE 0.77 05/15/2018   BILITOT 0.4 05/15/2018   ALKPHOS 108 01/10/2017   AST 15 05/15/2018   ALT 10 05/15/2018   PROT 6.3 05/15/2018    ALBUMIN 3.9 01/10/2017   CALCIUM 9.1 05/15/2018   GFRAA 101 05/15/2018    Speciality Comments: PLQ Eye Exam 05/07/17 WNL  Procedures:  No procedures performed Allergies: Penicillins   Assessment / Plan:     Visit Diagnoses: Psoriatic arthritis (HCC): She has no synovitis or dactylitis on exam.  She has not had any recent psoriatic arthritis flares.  She is clinically doing well on MTX 5 tablets po once weekly and folic acid 1 mg po daily.  She has no SI joint tenderness on exam.  She has no achilles tendonitis or plantar fasciitis.  She has no psoriasis at this time.  She will continue on this current treatment regimen.  She does not need any refills at this time.  She was advised to notify us if she develops increased joint pain or joint swelling.  She will follow up in 5 months.    Other psoriasis: She has no psoriasis at this time.   High risk medication use - Methotrexate 5 tablets by mouth every week, folic acid 1 mg by mouth daily. Most recent CBC/CMP within normal limits on 05/15/18.  Will monitor every 3 months and standing orders are in place.  She has not had any recent infections.   Trochanteric bursitis, left hip: She has tenderness of the left trochanteric bursa.  She performs stretching exercises regularly which are helpful.   Other medical conditions are listed as follows:   History of obesity  Left hand paresthesia - Resolved.   Orders: No orders of the defined types were placed in this encounter.  No orders of the defined types were placed in this encounter.   Follow-Up Instructions: Return in about 5 months (around 12/07/2018) for Psoriatic arthritis.   Gearldine Bienenstock, PA-C   I examined and evaluated the patient with Sherron Ales PA.  And had no synovitis on examination today.  She has some thickening of the PIP joints.  Joint protection muscle strengthening was discussed.  The plan of care was discussed as noted above.  [name], MD  Note - This record  has been created using Animal nutritionist.  Chart creation errors have been sought, but may not always  have been located. Such creation errors do not reflect on  the standard of medical care.

## 2018-07-09 ENCOUNTER — Ambulatory Visit: Payer: BC Managed Care – PPO | Admitting: Rheumatology

## 2018-07-09 ENCOUNTER — Encounter: Payer: Self-pay | Admitting: Rheumatology

## 2018-07-09 VITALS — BP 114/67 | HR 60 | Resp 13 | Ht 63.0 in | Wt 188.0 lb

## 2018-07-09 DIAGNOSIS — Z8639 Personal history of other endocrine, nutritional and metabolic disease: Secondary | ICD-10-CM

## 2018-07-09 DIAGNOSIS — L4050 Arthropathic psoriasis, unspecified: Secondary | ICD-10-CM

## 2018-07-09 DIAGNOSIS — L408 Other psoriasis: Secondary | ICD-10-CM

## 2018-07-09 DIAGNOSIS — M7062 Trochanteric bursitis, left hip: Secondary | ICD-10-CM | POA: Diagnosis not present

## 2018-07-09 DIAGNOSIS — R202 Paresthesia of skin: Secondary | ICD-10-CM

## 2018-07-09 DIAGNOSIS — Z79899 Other long term (current) drug therapy: Secondary | ICD-10-CM

## 2018-07-09 NOTE — Patient Instructions (Signed)
Standing Labs We placed an order today for your standing lab work.    Please come back and get your standing labs in April and every 3 months  We have open lab Monday through Friday from 8:30-11:30 AM and 1:30-4:00 PM  at the office of Dr. Shaili Deveshwar.   You may experience shorter wait times on Monday and Friday afternoons. The office is located at [address], [address], [place], [place] [address] No appointment is necessary.   Labs are drawn by [facility].  You may receive a bill from [facility] for your lab work.  If you wish to have your labs drawn at another location, please call the office 24 hours in advance to send orders.  If you have any questions regarding directions or hours of operation,  please call [phone].   Just as a reminder please drink plenty of water prior to coming for your lab work. Thanks!  

## 2018-07-28 ENCOUNTER — Other Ambulatory Visit: Payer: Self-pay | Admitting: Physician Assistant

## 2018-07-28 NOTE — Telephone Encounter (Signed)
Last Visit: 07/09/18 Next Visit: 12/10/18 Labs: 05/15/18 Glucose is 123. All other labs are WNL.  Okay to refill per Dr. Corliss Skains

## 2018-08-02 ENCOUNTER — Other Ambulatory Visit: Payer: Self-pay | Admitting: Rheumatology

## 2018-08-03 NOTE — Telephone Encounter (Signed)
Last Visit: 07/09/2018 Next Visit: 12/10/2018   Okay to refill per Dr. Corliss Skains.

## 2018-10-22 ENCOUNTER — Other Ambulatory Visit: Payer: Self-pay | Admitting: Nurse Practitioner

## 2018-10-22 DIAGNOSIS — Z1231 Encounter for screening mammogram for malignant neoplasm of breast: Secondary | ICD-10-CM

## 2018-10-25 ENCOUNTER — Other Ambulatory Visit: Payer: Self-pay | Admitting: Physician Assistant

## 2018-10-26 NOTE — Telephone Encounter (Signed)
Last Visit: 07/09/2018 Next Visit: 12/10/2018 Labs:  05/15/2018 Glucose is 123. All other labs are WNL.  Advised patient she is due to update labs, patient verbalized understanding and will come have them drawn this week. Provided patient with lab hours.   Okay to refill 30 day supply, per Dr. Estanislado Pandy.

## 2018-10-30 ENCOUNTER — Other Ambulatory Visit: Payer: Self-pay | Admitting: *Deleted

## 2018-10-30 DIAGNOSIS — L4050 Arthropathic psoriasis, unspecified: Secondary | ICD-10-CM

## 2018-10-30 DIAGNOSIS — Z79899 Other long term (current) drug therapy: Secondary | ICD-10-CM

## 2018-10-31 LAB — CBC WITH DIFFERENTIAL/PLATELET
Absolute Monocytes: 809 cells/uL (ref 200–950)
Basophils Absolute: 70 cells/uL (ref 0–200)
Basophils Relative: 0.8 %
Eosinophils Absolute: 252 cells/uL (ref 15–500)
Eosinophils Relative: 2.9 %
HCT: 39.8 % (ref 35.0–45.0)
Hemoglobin: 13.3 g/dL (ref 11.7–15.5)
Lymphs Abs: 2210 cells/uL (ref 850–3900)
MCH: 32.4 pg (ref 27.0–33.0)
MCHC: 33.4 g/dL (ref 32.0–36.0)
MCV: 96.8 fL (ref 80.0–100.0)
MPV: 10 fL (ref 7.5–12.5)
Monocytes Relative: 9.3 %
Neutro Abs: 5359 cells/uL (ref 1500–7800)
Neutrophils Relative %: 61.6 %
Platelets: 295 10*3/uL (ref 140–400)
RBC: 4.11 10*6/uL (ref 3.80–5.10)
RDW: 13.8 % (ref 11.0–15.0)
Total Lymphocyte: 25.4 %
WBC: 8.7 10*3/uL (ref 3.8–10.8)

## 2018-10-31 LAB — COMPLETE METABOLIC PANEL WITH GFR
AG Ratio: 1.3 (calc) (ref 1.0–2.5)
ALT: 11 U/L (ref 6–29)
AST: 15 U/L (ref 10–35)
Albumin: 3.6 g/dL (ref 3.6–5.1)
Alkaline phosphatase (APISO): 105 U/L (ref 37–153)
BUN: 13 mg/dL (ref 7–25)
CO2: 25 mmol/L (ref 20–32)
Calcium: 9 mg/dL (ref 8.6–10.4)
Chloride: 105 mmol/L (ref 98–110)
Creat: 0.8 mg/dL (ref 0.50–1.05)
GFR, Est African American: 96 mL/min/{1.73_m2} (ref >=60)
GFR, Est Non African American: 83 mL/min/{1.73_m2} (ref >=60)
Globulin: 2.8 g/dL (calc) (ref 1.9–3.7)
Glucose, Bld: 102 mg/dL — ABNORMAL HIGH (ref 65–99)
Potassium: 5 mmol/L (ref 3.5–5.3)
Sodium: 140 mmol/L (ref 135–146)
Total Bilirubin: 0.5 mg/dL (ref 0.2–1.2)
Total Protein: 6.4 g/dL (ref 6.1–8.1)

## 2018-11-20 ENCOUNTER — Other Ambulatory Visit: Payer: Self-pay | Admitting: Rheumatology

## 2018-11-20 NOTE — Telephone Encounter (Signed)
Last Visit: 07/09/2018 Next Visit: 12/10/2018 Labs:  10/30/18 WNL  Okay to refill per Dr. Estanislado Pandy

## 2018-11-26 NOTE — Progress Notes (Deleted)
Office Visit Note  Patient: [name]             Date of Birth: [birth year]           MRN: [id number]             PCP: Patient, No Pcp Per Referring: No ref. provider found Visit Date: 12/10/2018 Occupation: @GUAROCC @  Subjective:  No chief complaint on file.   History of Present Illness: [name] is a 58 y.o. female ***   Activities of Daily Living:  Patient reports morning stiffness for *** {minute/hour:19697}.   Patient {ACTIONS;DENIES/REPORTS:21021675::"Denies"} nocturnal pain.  Difficulty dressing/grooming: {ACTIONS;DENIES/REPORTS:21021675::"Denies"} Difficulty climbing stairs: {ACTIONS;DENIES/REPORTS:21021675::"Denies"} Difficulty getting out of chair: {ACTIONS;DENIES/REPORTS:21021675::"Denies"} Difficulty using hands for taps, buttons, cutlery, and/or writing: {ACTIONS;DENIES/REPORTS:21021675::"Denies"}  No Rheumatology ROS completed.   PMFS History:  Patient Active Problem List   Diagnosis Date Noted  . Pain in both hands 08/27/2016  . High risk medication use 08/27/2016  . Psoriatic arthritis ([place]) 08/27/2016  . History of obesity 08/27/2016  . Other psoriasis 08/27/2016  . Methotrexate, long term, current use 06/22/2016    Past Medical History:  Diagnosis Date  . Migraines   . Osteoarthritis   . Psoriasis   . Psoriatic arthritis ([place])     Family History  Problem Relation Age of Onset  . Cancer Mother        Lung  . Heart disease Father   . Healthy Son   . Healthy Son    Past Surgical History:  Procedure Laterality Date  . [place] History   Social History Narrative  . Not on file    There is no immunization history on file for this patient.   Objective: Vital Signs: LMP 07/15/2013    Physical Exam   Musculoskeletal Exam: ***  CDAI Exam: CDAI Score: - Patient Global: -; Provider Global: - Swollen: -; Tender: - Joint Exam   No joint exam has been documented for this visit   There is  currently no information documented on the homunculus. Go to the Rheumatology activity and complete the homunculus joint exam.  Investigation: No additional findings.  Imaging: No results found.  Recent Labs: Lab Results  Component Value Date   WBC 8.7 10/30/2018   HGB 13.3 10/30/2018   PLT 295 10/30/2018   NA 140 10/30/2018   K 5.0 10/30/2018   CL 105 10/30/2018   CO2 25 10/30/2018   GLUCOSE 102 (H) 10/30/2018   BUN 13 10/30/2018   CREATININE 0.80 10/30/2018   BILITOT 0.5 10/30/2018   ALKPHOS 108 01/10/2017   AST 15 10/30/2018   ALT 11 10/30/2018   PROT 6.4 10/30/2018   ALBUMIN 3.9 01/10/2017   CALCIUM 9.0 10/30/2018   GFRAA 96 10/30/2018    Speciality Comments: PLQ Eye Exam 05/07/17 WNL  Procedures:  No procedures performed Allergies: Penicillins   Assessment / Plan:     Visit Diagnoses: No diagnosis found.  Orders: No orders of the defined types were placed in this encounter.  No orders of the defined types were placed in this encounter.   Face-to-face time spent with patient was *** minutes. Greater than 50% of time was spent in counseling and coordination of care.  Follow-Up Instructions: No follow-ups on file.   Earnestine Mealing, CMA  Note - This record has been created using Editor, commissioning.  Chart creation errors have been sought, but may not always  have been located. Such creation errors do  not reflect on  the standard of medical care.

## 2018-12-09 ENCOUNTER — Ambulatory Visit
Admission: RE | Admit: 2018-12-09 | Discharge: 2018-12-09 | Disposition: A | Discharge status: Home / Self Care | Payer: BC Managed Care – PPO | Source: Ambulatory Visit | Attending: Nurse Practitioner | Admitting: Nurse Practitioner

## 2018-12-09 ENCOUNTER — Other Ambulatory Visit: Payer: Self-pay

## 2018-12-09 DIAGNOSIS — Z1231 Encounter for screening mammogram for malignant neoplasm of breast: Secondary | ICD-10-CM

## 2018-12-10 ENCOUNTER — Ambulatory Visit: Payer: Self-pay | Admitting: Rheumatology

## 2018-12-10 ENCOUNTER — Other Ambulatory Visit: Payer: Self-pay | Admitting: Nurse Practitioner

## 2018-12-10 DIAGNOSIS — N63 Unspecified lump in unspecified breast: Secondary | ICD-10-CM

## 2018-12-10 DIAGNOSIS — N6489 Other specified disorders of breast: Secondary | ICD-10-CM

## 2018-12-21 ENCOUNTER — Ambulatory Visit
Admission: RE | Admit: 2018-12-21 | Discharge: 2018-12-21 | Disposition: A | Discharge status: Home / Self Care | Payer: BC Managed Care – PPO | Source: Ambulatory Visit | Attending: Nurse Practitioner | Admitting: Nurse Practitioner

## 2018-12-21 ENCOUNTER — Other Ambulatory Visit: Payer: Self-pay

## 2018-12-21 ENCOUNTER — Ambulatory Visit: Payer: BC Managed Care – PPO

## 2018-12-21 DIAGNOSIS — N63 Unspecified lump in unspecified breast: Secondary | ICD-10-CM

## 2018-12-21 DIAGNOSIS — N6489 Other specified disorders of breast: Secondary | ICD-10-CM

## 2018-12-29 NOTE — Progress Notes (Deleted)
Office Visit Note  Patient: [name]             Date of Birth: 1961-05-05           MRN: [id number]             PCP: Patient, No Pcp Per Referring: No ref. provider found Visit Date: 01/12/2019 Occupation: @GUAROCC @  Subjective:  No chief complaint on file.   History of Present Illness: [name] is a 58 y.o. female ***   Activities of Daily Living:  Patient reports morning stiffness for *** {minute/hour:19697}.   Patient {ACTIONS;DENIES/REPORTS:21021675::"Denies"} nocturnal pain.  Difficulty dressing/grooming: {ACTIONS;DENIES/REPORTS:21021675::"Denies"} Difficulty climbing stairs: {ACTIONS;DENIES/REPORTS:21021675::"Denies"} Difficulty getting out of chair: {ACTIONS;DENIES/REPORTS:21021675::"Denies"} Difficulty using hands for taps, buttons, cutlery, and/or writing: {ACTIONS;DENIES/REPORTS:21021675::"Denies"}  No Rheumatology ROS completed.   PMFS History:  Patient Active Problem List   Diagnosis Date Noted  . Pain in both hands 08/27/2016  . High risk medication use 08/27/2016  . Psoriatic arthritis ([place]) 08/27/2016  . History of obesity 08/27/2016  . Other psoriasis 08/27/2016  . Methotrexate, long term, current use 06/22/2016    Past Medical History:  Diagnosis Date  . Migraines   . Osteoarthritis   . Psoriasis   . Psoriatic arthritis ([place])     Family History  Problem Relation Age of Onset  . Cancer Mother        Lung  . Heart disease Father   . Healthy Son   . Healthy Son    Past Surgical History:  Procedure Laterality Date  . [place] History   Social History Narrative  . Not on file    There is no immunization history on file for this patient.   Objective: Vital Signs: LMP 07/15/2013    Physical Exam   Musculoskeletal Exam: ***  CDAI Exam: CDAI Score: - Patient Global: -; Provider Global: - Swollen: -; Tender: - Joint Exam   No joint exam has been documented for this visit   There is  currently no information documented on the homunculus. Go to the Rheumatology activity and complete the homunculus joint exam.  Investigation: No additional findings.  Imaging: Mm Diag Breast Tomo Bilateral  Result Date: 12/21/2018 CLINICAL DATA:  58 year old female presenting for evaluation of left breast swelling. EXAM: DIGITAL DIAGNOSTIC BILATERAL MAMMOGRAM WITH CAD AND TOMO COMPARISON:  Previous exam(s). ACR Breast Density Category b: There are scattered areas of fibroglandular density. FINDINGS: No suspicious calcifications, masses or areas of distortion are seen in the bilateral breasts. Mammographic images were processed with CAD. IMPRESSION: 1. There are no mammographic findings in the left breast to explain the patient's sensation of fullness. 2.  No mammographic evidence of malignancy in the bilateral breasts. RECOMMENDATION: 1. Clinical follow-up recommended for the left breast fullness. Any further workup should be based on clinical grounds. 2.  Screening mammogram in one year.(Code:SM-B-01Y) I have discussed the findings and recommendations with the patient. Results were also provided in writing at the conclusion of the visit. If applicable, a reminder letter will be sent to the patient regarding the next appointment. BI-RADS CATEGORY  1: Negative. Electronically Signed   By: Ammie Ferrier M.D.   On: 12/21/2018 14:37    Recent Labs: Lab Results  Component Value Date   WBC 8.7 10/30/2018   HGB 13.3 10/30/2018   PLT 295 10/30/2018   NA 140 10/30/2018   K 5.0 10/30/2018   CL 105 10/30/2018   CO2 25 10/30/2018  GLUCOSE 102 (H) 10/30/2018   BUN 13 10/30/2018   CREATININE 0.80 10/30/2018   BILITOT 0.5 10/30/2018   ALKPHOS 108 01/10/2017   AST 15 10/30/2018   ALT 11 10/30/2018   PROT 6.4 10/30/2018   ALBUMIN 3.9 01/10/2017   CALCIUM 9.0 10/30/2018   GFRAA 96 10/30/2018    Speciality Comments: PLQ Eye Exam 05/07/17 WNL  Procedures:  No procedures performed Allergies:  Penicillins   Assessment / Plan:     Visit Diagnoses: No diagnosis found.  Orders: No orders of the defined types were placed in this encounter.  No orders of the defined types were placed in this encounter.   Face-to-face time spent with patient was *** minutes. Greater than 50% of time was spent in counseling and coordination of care.  Follow-Up Instructions: No follow-ups on file.   Ellen Henri, CMA  Note - This record has been created using Animal nutritionist.  Chart creation errors have been sought, but may not always  have been located. Such creation errors do not reflect on  the standard of medical care.

## 2019-01-08 NOTE — Progress Notes (Signed)
Office Visit Note  Patient: [name]             Date of Birth: [birth year]           MRN: [id number]             PCP: Patient, No Pcp Per Referring: No ref. provider found Visit Date: 01/13/2019 Occupation: @GUAROCC @  Subjective:  Medication monitoring   History of Present Illness: [name] is a 58 y.o. female with psoriatic arthritis.  She is taking Methotrexate 4 tablets by mouth once weekly and folic acid 1 mg po daily.  She denies any recent flares.  She reports that she reduced her dose of methotrexate from [place] tablets to 4 tablets by mouth once weekly about 1 month ago on her own.  She is not any increased joint pain or stiffness since then.  She reports that she has been exercising on a regular basis and has been walking 6 miles daily as well as been performing yoga.  She denies any plantar fasciitis or Achilles tendinitis.  She denies any SI joint pain at this time.  She has not had any psoriasis.  She denies any recent infections.  Activities of Daily Living:  Patient reports morning stiffness for 5 minutes.   Patient Denies nocturnal pain.  Difficulty dressing/grooming: Denies Difficulty climbing stairs: Denies Difficulty getting out of chair: Denies Difficulty using hands for taps, buttons, cutlery, and/or writing: Denies  Review of Systems  Constitutional: Negative for fatigue.  HENT: Negative for mouth sores, trouble swallowing, trouble swallowing, mouth dryness and nose dryness.   Eyes: Negative for pain, itching and dryness.  Respiratory: Negative for shortness of breath, wheezing and difficulty breathing.   Cardiovascular: Negative for chest pain and palpitations.  Gastrointestinal: Negative for blood in stool, constipation and diarrhea.  Endocrine: Negative for increased urination.  Genitourinary: Negative for difficulty urinating and painful urination.  Musculoskeletal: Negative for arthralgias, joint pain, joint swelling and morning stiffness.   Skin: Negative for rash, hair loss and redness.  Allergic/Immunologic: Negative for susceptible to infections.  Neurological: Negative for dizziness, light-headedness, headaches, memory loss and weakness.  Hematological: Negative for bruising/bleeding tendency.  Psychiatric/Behavioral: Negative for confusion and sleep disturbance.    PMFS History:  Patient Active Problem List   Diagnosis Date Noted  . Pain in both hands 08/27/2016  . High risk medication use 08/27/2016  . Psoriatic arthritis ([facility]) 08/27/2016  . History of obesity 08/27/2016  . Other psoriasis 08/27/2016  . Methotrexate, long term, current use 06/22/2016    Past Medical History:  Diagnosis Date  . Migraines   . Osteoarthritis   . Psoriasis   . Psoriatic arthritis ([facility])     Family History  Problem Relation Age of Onset  . Cancer Mother        Lung  . Heart disease Father   . Healthy Son   . Healthy Son    Past Surgical History:  Procedure Laterality Date  . CESAREAN SECTION  1995, 1998   Social History   Social History Narrative  . Not on file    There is no immunization history on file for this patient.   Objective: Vital Signs: BP 115/68 (BP Location: Left Arm, Patient Position: Sitting, Cuff Size: Normal)   Pulse (!) 57   Resp 12   Ht 5\' 3"  (1.6 m)   Wt 192 lb 9.6 oz (87.4 kg)   LMP 07/15/2013   BMI 34.12 kg/m    Physical Exam  Vitals signs and nursing note reviewed.  Constitutional:      Appearance: She is well-developed.  HENT:     Head: Normocephalic and atraumatic.  Eyes:     Conjunctiva/sclera: Conjunctivae normal.  Neck:     Musculoskeletal: Normal range of motion.  Cardiovascular:     Rate and Rhythm: Normal rate and regular rhythm.     Heart sounds: Normal heart sounds.  Pulmonary:     Effort: Pulmonary effort is normal.     Breath sounds: Normal breath sounds.  Abdominal:     General: Bowel sounds are normal.     Palpations: Abdomen is soft.  Lymphadenopathy:      Cervical: No cervical adenopathy.  Skin:    General: Skin is warm and dry.     Capillary Refill: Capillary refill takes less than 2 seconds.  Neurological:     Mental Status: She is alert and oriented to person, place, and time.  Psychiatric:        Behavior: Behavior normal.      Musculoskeletal Exam: C-spine, thoracic spine, lumbar spine good range of motion.  No midline spinal tenderness.  No SI joint tenderness.  Shoulder joints, elbow joints, wrist joints, MCPs, PIPs, DIPs good range of motion no synovitis.  She has complete fist formation bilaterally.  She has PIP and DIP synovial thickening.  Hip joints, knee joints, ankle joints, MTPs, PIPs and DIPs good range of motion no synovitis.  No warmth or effusion of bilateral knee joints.  No tenderness or swelling of ankle joints.  No tenderness over trochanteric bursa bilaterally.   CDAI Exam: CDAI Score: - Patient Global: -; Provider Global: - Swollen: -; Tender: - Joint Exam   No joint exam has been documented for this visit   There is currently no information documented on the homunculus. Go to the Rheumatology activity and complete the homunculus joint exam.  Investigation: No additional findings.  Imaging: Mm Diag Breast Tomo Bilateral  Result Date: 12/21/2018 CLINICAL DATA:  58 year old female presenting for evaluation of left breast swelling. EXAM: DIGITAL DIAGNOSTIC BILATERAL MAMMOGRAM WITH CAD AND TOMO COMPARISON:  Previous exam(s). ACR Breast Density Category b: There are scattered areas of fibroglandular density. FINDINGS: No suspicious calcifications, masses or areas of distortion are seen in the bilateral breasts. Mammographic images were processed with CAD. IMPRESSION: 1. There are no mammographic findings in the left breast to explain the patient's sensation of fullness. 2.  No mammographic evidence of malignancy in the bilateral breasts. RECOMMENDATION: 1. Clinical follow-up recommended for the left breast fullness.  Any further workup should be based on clinical grounds. 2.  Screening mammogram in one year.(Code:SM-B-01Y) I have discussed the findings and recommendations with the patient. Results were also provided in writing at the conclusion of the visit. If applicable, a reminder letter will be sent to the patient regarding the next appointment. BI-RADS CATEGORY  1: Negative. Electronically Signed   By: Frederico HammanMichelle  Collins M.D.   On: 12/21/2018 14:37    Recent Labs: Lab Results  Component Value Date   WBC 8.7 10/30/2018   HGB 13.3 10/30/2018   PLT 295 10/30/2018   NA 140 10/30/2018   K 5.0 10/30/2018   CL 105 10/30/2018   CO2 25 10/30/2018   GLUCOSE 102 (H) 10/30/2018   BUN 13 10/30/2018   CREATININE 0.80 10/30/2018   BILITOT 0.5 10/30/2018   ALKPHOS 108 01/10/2017   AST 15 10/30/2018   ALT 11 10/30/2018   PROT 6.4 10/30/2018   ALBUMIN  3.9 01/10/2017   CALCIUM 9.0 10/30/2018   GFRAA 96 10/30/2018    Speciality Comments: PLQ Eye Exam 05/07/17 WNL  Procedures:  No procedures performed Allergies: Penicillins   Assessment / Plan:     Visit Diagnoses: Psoriatic arthritis ([place]) -She has no synovitis or dactylitis on exam.  She has had any recent psoriatic arthritis flares.  She is clinically doing well on methotrexate 4 tablets by mouth once weekly and folic acid 1 mg by mouth daily.  She reduced her dose of methotrexate from 5 tablets to 4 tablets once weekly about 1 month ago and has not noticed any new or worsening symptoms.  She has no joint pain or joint swelling at this time.  She has morning stiffness lasting about 5 minutes.  She has no Achilles tendinitis or plantar fasciitis.  No SI joint tenderness noted.  He has not had any psoriasis recently.  He has been walking 6 miles daily and has been performing yoga on a regular basis.  She will continue taking methotrexate 4 tablets by mouth once weekly and folic acid 1 mg by mouth daily.  She does not need any refills at this time.  X-rays of  both hands and feet will be obtained today to assess for radiographic progression.  She was advised to notify us if she develops increased joint pain or joint swelling.  She will follow-up in the office in 5 months.  Plan: XR Hand 2 View Right, XR Hand 2 View Left, XR Foot 2 Views Right, XR Foot 2 Views Left  Other psoriasis: She has no psoriasis at this time.  High risk medication use -  Methotrexate 4 tablets by mouth every week, folic acid 1 mg by mouth daily.  CBC and CMP will be drawn today to monitor for drug toxicity.  She will return for lab work in December and every 3 months to monitor for lab abnormalities.  She was advised to hold methotrexate if she develops any signs or symptoms of an infection and to resume once the infection has completely cleared.  We discussed the importance of social distancing and following the standard precautions recommended by the CDC.- Plan: CBC with Differential/Platelet, COMPLETE METABOLIC PANEL WITH GFR  Trochanteric bursitis, left hip: Resolved   Left hand paresthesia: Resolved   Orders: Orders Placed This Encounter  Procedures  . XR Hand 2 View Right  . XR Hand 2 View Left  . XR Foot 2 Views Right  . XR Foot 2 Views Left  . CBC with Differential/Platelet  . COMPLETE METABOLIC PANEL WITH GFR   No orders of the defined types were placed in this encounter.   Face-to-face time spent with patient was 30 minutes. Greater than 50% of time was spent in counseling and coordination of care.  Follow-Up Instructions: Return in about 5 months (around 06/15/2019) for Psoriatic arthritis.   Ofilia Neas, PA-C   I examined and evaluated the patient with Hazel Sams PA.  Patient had no synovitis on examination today.  Her x-ray joints do not show any progression of her disease process.  The plan of care was discussed as noted above.  Bo Merino, MD  Note - This record has been created using Editor, commissioning.  Chart creation errors have been sought,  but may not always  have been located. Such creation errors do not reflect on  the standard of medical care.

## 2019-01-12 ENCOUNTER — Ambulatory Visit: Payer: BC Managed Care – PPO | Admitting: Rheumatology

## 2019-01-13 ENCOUNTER — Ambulatory Visit: Payer: Self-pay

## 2019-01-13 ENCOUNTER — Encounter: Payer: Self-pay | Admitting: Physician Assistant

## 2019-01-13 ENCOUNTER — Ambulatory Visit: Payer: BC Managed Care – PPO | Admitting: Physician Assistant

## 2019-01-13 ENCOUNTER — Other Ambulatory Visit: Payer: Self-pay

## 2019-01-13 VITALS — BP 115/68 | HR 57 | Resp 12 | Ht 63.0 in | Wt 192.6 lb

## 2019-01-13 DIAGNOSIS — L408 Other psoriasis: Secondary | ICD-10-CM | POA: Diagnosis not present

## 2019-01-13 DIAGNOSIS — M7062 Trochanteric bursitis, left hip: Secondary | ICD-10-CM | POA: Diagnosis not present

## 2019-01-13 DIAGNOSIS — R202 Paresthesia of skin: Secondary | ICD-10-CM

## 2019-01-13 DIAGNOSIS — Z79899 Other long term (current) drug therapy: Secondary | ICD-10-CM | POA: Diagnosis not present

## 2019-01-13 DIAGNOSIS — L4050 Arthropathic psoriasis, unspecified: Secondary | ICD-10-CM

## 2019-01-14 LAB — CBC WITH DIFFERENTIAL/PLATELET
Absolute Monocytes: 720 cells/uL (ref 200–950)
Basophils Absolute: 72 cells/uL (ref 0–200)
Basophils Relative: 0.8 %
Eosinophils Absolute: 180 cells/uL (ref 15–500)
Eosinophils Relative: 2 %
HCT: 39.5 % (ref 35.0–45.0)
Hemoglobin: 13.2 g/dL (ref 11.7–15.5)
Lymphs Abs: 2133 cells/uL (ref 850–3900)
MCH: 33 pg (ref 27.0–33.0)
MCHC: 33.4 g/dL (ref 32.0–36.0)
MCV: 98.8 fL (ref 80.0–100.0)
MPV: 9.6 fL (ref 7.5–12.5)
Monocytes Relative: 8 %
Neutro Abs: 5895 cells/uL (ref 1500–7800)
Neutrophils Relative %: 65.5 %
Platelets: 323 10*3/uL (ref 140–400)
RBC: 4 10*6/uL (ref 3.80–5.10)
RDW: 14.1 % (ref 11.0–15.0)
Total Lymphocyte: 23.7 %
WBC: 9 10*3/uL (ref 3.8–10.8)

## 2019-01-14 LAB — COMPLETE METABOLIC PANEL WITH GFR
AG Ratio: 1.4 (calc) (ref 1.0–2.5)
ALT: 11 U/L (ref 6–29)
AST: 17 U/L (ref 10–35)
Albumin: 3.7 g/dL (ref 3.6–5.1)
Alkaline phosphatase (APISO): 84 U/L (ref 37–153)
BUN: 9 mg/dL (ref 7–25)
CO2: 28 mmol/L (ref 20–32)
Calcium: 9.2 mg/dL (ref 8.6–10.4)
Chloride: 106 mmol/L (ref 98–110)
Creat: 0.8 mg/dL (ref 0.50–1.05)
GFR, Est African American: 96 mL/min/{1.73_m2} (ref >=60)
GFR, Est Non African American: 83 mL/min/{1.73_m2} (ref >=60)
Globulin: 2.6 g/dL (calc) (ref 1.9–3.7)
Glucose, Bld: 109 mg/dL — ABNORMAL HIGH (ref 65–99)
Potassium: 5.4 mmol/L — ABNORMAL HIGH (ref 3.5–5.3)
Sodium: 142 mmol/L (ref 135–146)
Total Bilirubin: 0.6 mg/dL (ref 0.2–1.2)
Total Protein: 6.3 g/dL (ref 6.1–8.1)

## 2019-01-14 NOTE — Progress Notes (Signed)
CBC WNL.  Potassium is borderline elevated.  Please notify patient and forward labs to PCP. Rest of CMP WNL.

## 2019-02-18 ENCOUNTER — Other Ambulatory Visit: Payer: Self-pay | Admitting: Rheumatology

## 2019-02-18 NOTE — Telephone Encounter (Signed)
Last Visit: 01/13/19 Next Visit: 05/17/19 Labs: 01/13/19 CBC WNL. Potassium is borderline elevated. Rest of CMP WNL.  Okay to refill per Dr. Estanislado Pandy

## 2019-03-02 IMAGING — MG DIGITAL SCREENING BILATERAL MAMMOGRAM WITH TOMO AND CAD
5 series · 6 of 17 positions shown · non-contrast
Comparison: Previous exam(s).

CLINICAL DATA: Screening.

EXAM:
DIGITAL SCREENING BILATERAL MAMMOGRAM WITH TOMO AND CAD

[R CC synth-2D]
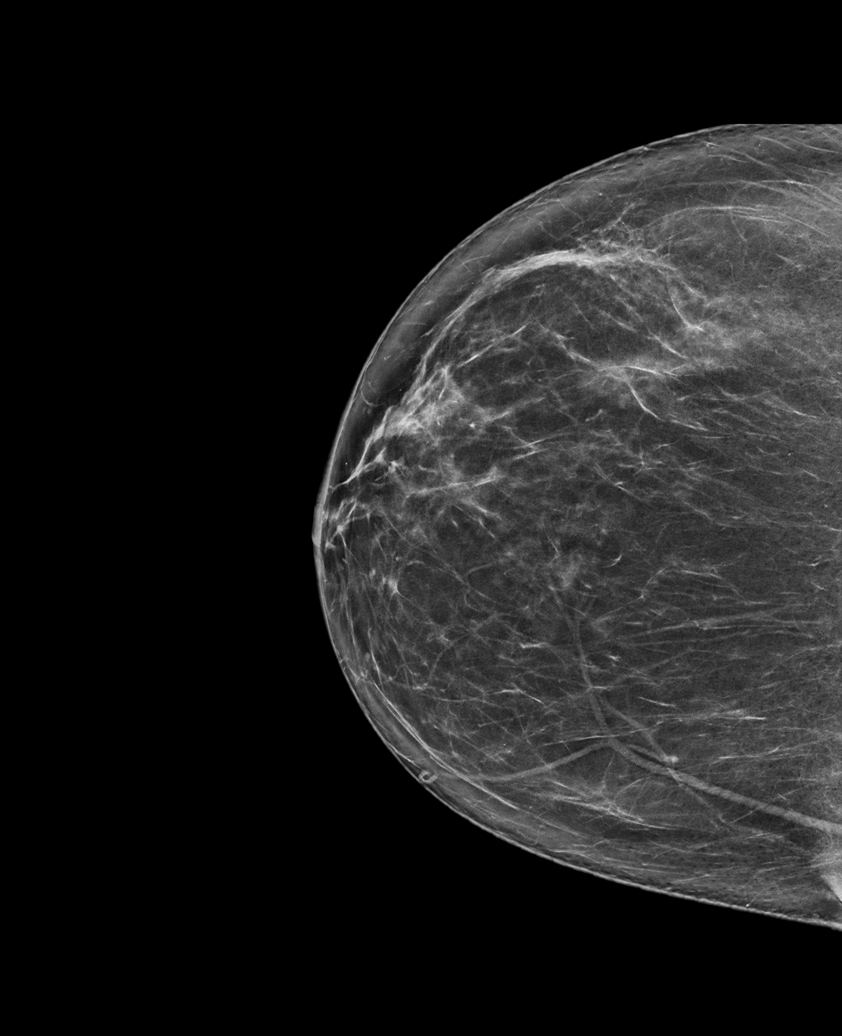

[L CC synth-2D]
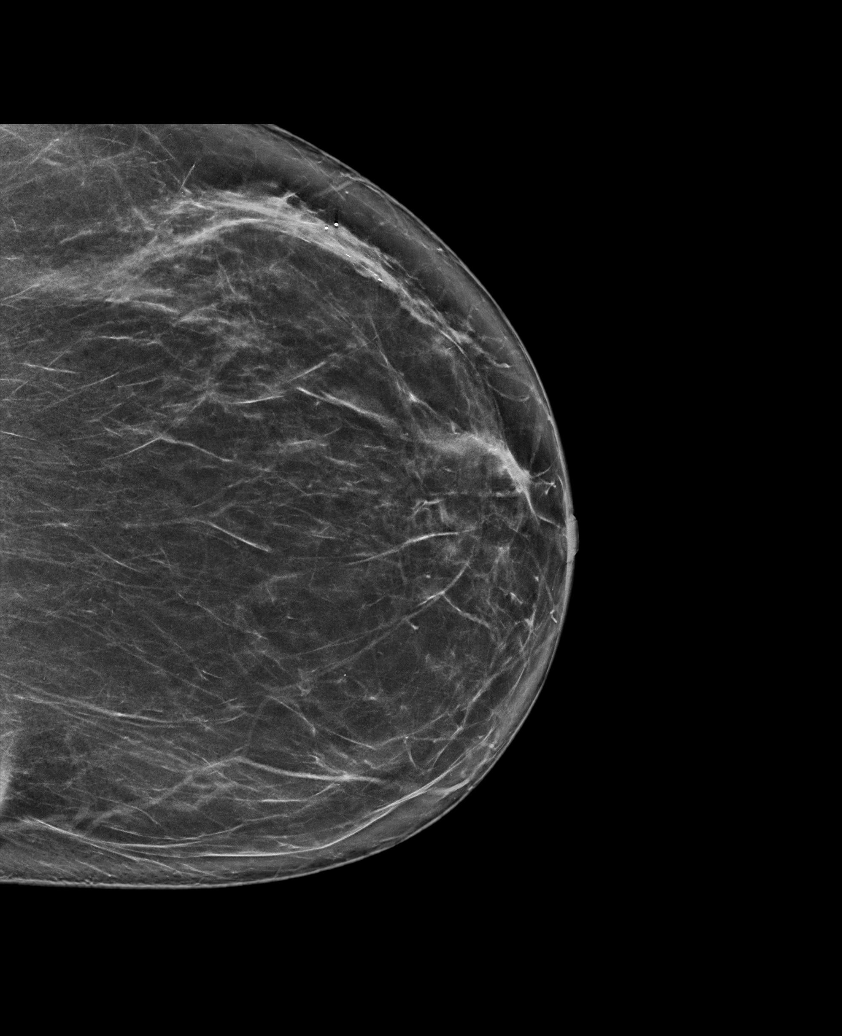

[L MLO tomo · 2 of 92 frames shown]
[frame 30/92]
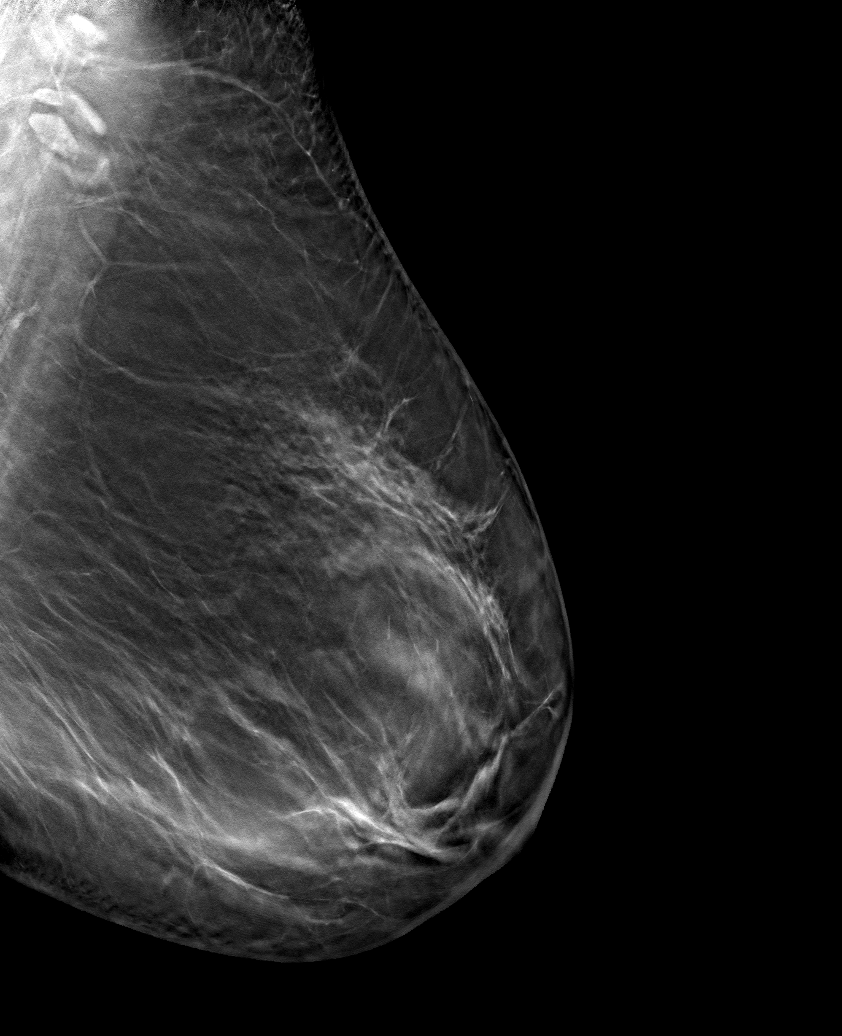
[frame 47/92]
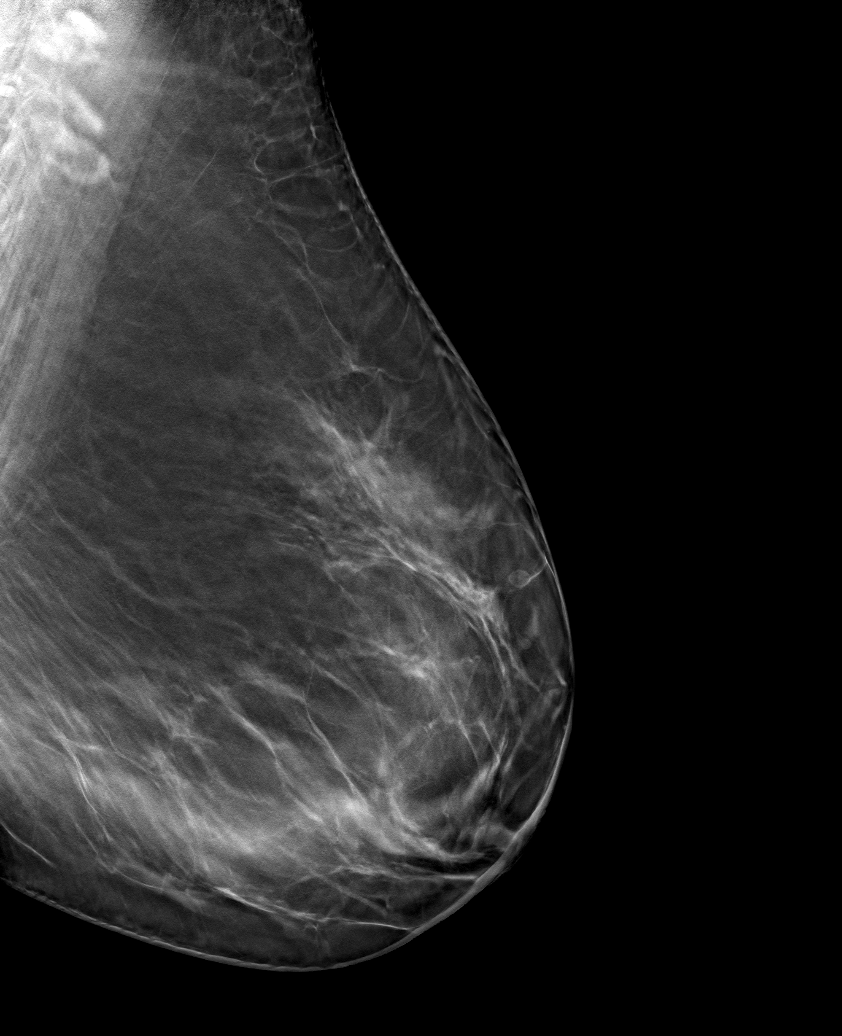

[R MLO tomo · tomo slice 45/89.0]
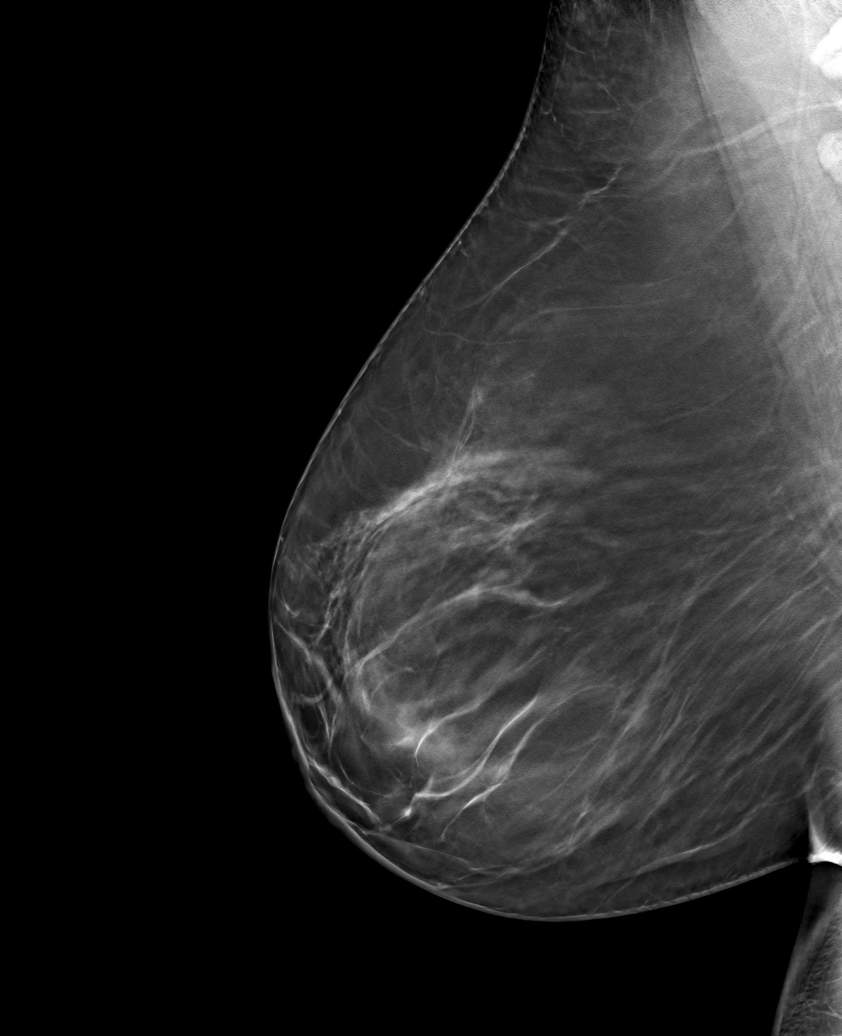

[L CC tomo · tomo slice 41/81.0]
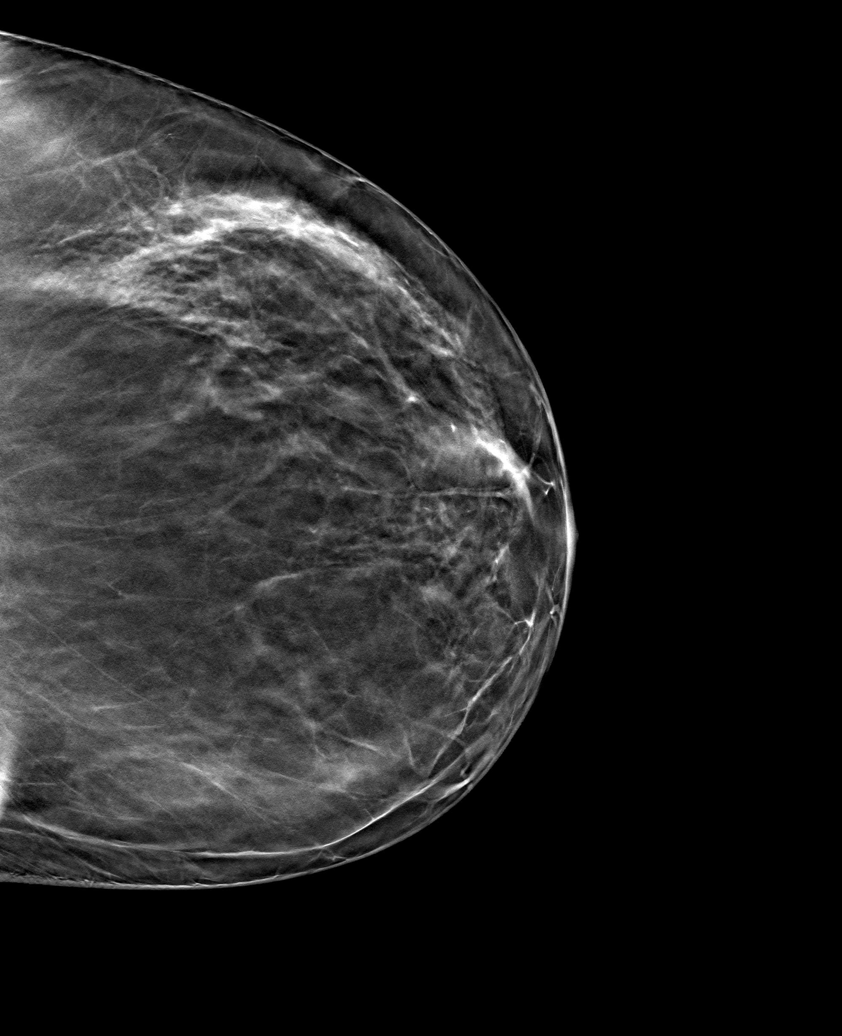

[6 of 17 positions shown; findings below may reference images not displayed]

ACR Breast Density Category c: The breast tissue is heterogeneously
dense, which may obscure small masses.
FINDINGS: There are no findings suspicious for malignancy. Images were
processed with CAD.
IMPRESSION: No mammographic evidence of malignancy. A result letter of this
screening mammogram will be mailed directly to the patient.

RECOMMENDATION:
Screening mammogram in one year. (Code:[id number])

BI-RADS CATEGORY  1: Negative.

## 2019-05-20 ENCOUNTER — Other Ambulatory Visit: Payer: Self-pay | Admitting: Nurse Practitioner

## 2019-05-25 ENCOUNTER — Other Ambulatory Visit: Payer: Self-pay | Admitting: Nurse Practitioner

## 2019-05-25 DIAGNOSIS — M069 Rheumatoid arthritis, unspecified: Secondary | ICD-10-CM

## 2019-06-11 NOTE — Progress Notes (Signed)
Virtual Visit via Video Note  I connected with [name] on 06/14/19 at  9:30 AM EST by a video enabled telemedicine application and verified that I am speaking with the correct person using two identifiers.  Location: Patient: Home  Provider: Clinic This service was conducted via virtual visit.  Both audio and visual tools were used.  The patient was located at home. I was located in my office.  Consent was obtained prior to the virtual visit and is aware of possible charges through their insurance for this visit.  The patient is an established patient.  Dr. Estanislado Pandy, MD conducted the virtual visit and Hazel Sams, PA-C acted as scribe during the service.  Office staff helped with scheduling follow up visits after the service was conducted.     I discussed the limitations of evaluation and management by telemedicine and the availability of in person appointments. The patient expressed understanding and agreed to proceed.  CC: Medication monitoring  History of Present Illness: Patient is a 59 year old female with a past medical history of psoriatic arthritis.  She is taking methotrexate 4 tablets po once weekly and folic acid 1 mg po daily.  She denies any recent psoriatic arthritis flares.  She denies any joint pain or joint swelling at this time.  She denies any achilles tendonitis or plantar fasciitis. 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 She denies any SI joint pain. She has not had any psoriasis recently. She denies any eye inflammation recently.    Review of Systems  Constitutional: Negative for fever and malaise/fatigue.  HENT: Negative for congestion.   Eyes: Negative for photophobia, pain, discharge and redness.  Respiratory: Negative for cough, shortness of breath and wheezing.   Cardiovascular: Negative for chest pain, palpitations and leg swelling.   Gastrointestinal: Negative for blood in stool, constipation and diarrhea.  Genitourinary: Negative for dysuria and frequency.  Musculoskeletal: Negative for back pain, joint pain, myalgias and neck pain.  Skin: Negative for rash.  Neurological: Negative for dizziness, weakness and headaches.  Endo/Heme/Allergies: Does not bruise/bleed easily.  Psychiatric/Behavioral: Negative for depression and memory loss. The patient is not nervous/anxious and does not have insomnia.       Observations/Objective: Physical Exam  Constitutional: She is oriented to person, place, and time and well-developed, well-nourished, and in no distress.  HENT:  Head: Normocephalic and atraumatic.  Eyes: Conjunctivae are normal.  Pulmonary/Chest: Effort normal.  Neurological: She is alert and oriented to person, place, and time.  Psychiatric: Mood, memory, affect and judgment normal.   Patient reports morning stiffness for 10 minutes.   Patient denies nocturnal pain.  Difficulty dressing/grooming: Denies Difficulty climbing stairs: Denies Difficulty getting out of chair: Denies Difficulty using hands for taps, buttons, cutlery, and/or writing: Denies   Assessment and Plan: Visit Diagnoses: Psoriatic arthritis ([place]) -She has not had any signs or symptoms of a psoriatic arthritis flare.  She is clinically doing well on Methotrexate 4 tablets po once weekly and folic acid 1 mg po daily.  She has no joint pain or joint swelling at this time.  Her morning stiffness has been lasting about 10 minutes daily.  She is not having any achilles tendonitis or plantar fasciitis.  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 She was encouraged to use voltaren topically as needed.  We will further assess at her follow up visit. Denies SI joint pain.  She has no active psoriasis at this time.  She will continue taking methotrexate 4 tablets po once weekly and folic acid 1 mg po  daily.  She is overdue to update lab work and is planning on coming for labs this week.  She was advised to notify us if she develops increased joint pain or joint swelling. She will follow up in 4-5 months.   Other psoriasis: She has no psoriasis at this time.  High risk medication use -  Methotrexate 4 tablets by mouth every week, folic acid 1 mg by mouth daily.  CBC and CMP were drawn on 01/13/19.  She is overdue to update lab work.  Standing orders are in place.   Trochanteric bursitis, left hip: Resolved   Left hand paresthesia: Resolved   Follow Up Instructions: She will follow up in 4-5 months    I discussed the assessment and treatment plan with the patient. The patient was provided an opportunity to ask questions and all were answered. The patient agreed with the plan and demonstrated an understanding of the instructions.   The patient was advised to call back or seek an in-person evaluation if the symptoms worsen or if the condition fails to improve as anticipated.  I provided 15 minutes of non-face-to-face time during this encounter.   [name], MD    Scribed byLadona Ridgel Dale,PA-C

## 2019-06-14 ENCOUNTER — Other Ambulatory Visit: Payer: Self-pay

## 2019-06-14 ENCOUNTER — Encounter: Payer: Self-pay | Admitting: Rheumatology

## 2019-06-14 ENCOUNTER — Telehealth (INDEPENDENT_AMBULATORY_CARE_PROVIDER_SITE_OTHER): Payer: BC Managed Care – PPO | Admitting: Rheumatology

## 2019-06-14 DIAGNOSIS — Z79899 Other long term (current) drug therapy: Secondary | ICD-10-CM | POA: Diagnosis not present

## 2019-06-14 DIAGNOSIS — L408 Other psoriasis: Secondary | ICD-10-CM

## 2019-06-14 DIAGNOSIS — M7062 Trochanteric bursitis, left hip: Secondary | ICD-10-CM | POA: Diagnosis not present

## 2019-06-14 DIAGNOSIS — L4050 Arthropathic psoriasis, unspecified: Secondary | ICD-10-CM

## 2019-06-14 DIAGNOSIS — R202 Paresthesia of skin: Secondary | ICD-10-CM

## 2019-06-17 ENCOUNTER — Other Ambulatory Visit: Payer: Self-pay

## 2019-06-17 DIAGNOSIS — Z79899 Other long term (current) drug therapy: Secondary | ICD-10-CM

## 2019-06-18 LAB — CBC WITH DIFFERENTIAL/PLATELET
Absolute Monocytes: 796 cells/uL (ref 200–950)
Basophils Absolute: 51 cells/uL (ref 0–200)
Basophils Relative: 0.7 %
Eosinophils Absolute: 161 cells/uL (ref 15–500)
Eosinophils Relative: 2.2 %
HCT: 38.2 % (ref 35.0–45.0)
Hemoglobin: 13.1 g/dL (ref 11.7–15.5)
Lymphs Abs: 2314 cells/uL (ref 850–3900)
MCH: 32.4 pg (ref 27.0–33.0)
MCHC: 34.3 g/dL (ref 32.0–36.0)
MCV: 94.6 fL (ref 80.0–100.0)
MPV: 10.2 fL (ref 7.5–12.5)
Monocytes Relative: 10.9 %
Neutro Abs: 3979 cells/uL (ref 1500–7800)
Neutrophils Relative %: 54.5 %
Platelets: 279 10*3/uL (ref 140–400)
RBC: 4.04 10*6/uL (ref 3.80–5.10)
RDW: 14.2 % (ref 11.0–15.0)
Total Lymphocyte: 31.7 %
WBC: 7.3 10*3/uL (ref 3.8–10.8)

## 2019-06-18 LAB — COMPLETE METABOLIC PANEL WITH GFR
AG Ratio: 1.4 (calc) (ref 1.0–2.5)
ALT: 11 U/L (ref 6–29)
AST: 16 U/L (ref 10–35)
Albumin: 3.9 g/dL (ref 3.6–5.1)
Alkaline phosphatase (APISO): 101 U/L (ref 37–153)
BUN: 11 mg/dL (ref 7–25)
CO2: 29 mmol/L (ref 20–32)
Calcium: 9.3 mg/dL (ref 8.6–10.4)
Chloride: 105 mmol/L (ref 98–110)
Creat: 0.74 mg/dL (ref 0.50–1.05)
GFR, Est African American: 105 mL/min/{1.73_m2} (ref >=60)
GFR, Est Non African American: 91 mL/min/{1.73_m2} (ref >=60)
Globulin: 2.7 g/dL (calc) (ref 1.9–3.7)
Glucose, Bld: 110 mg/dL — ABNORMAL HIGH (ref 65–99)
Potassium: 4.4 mmol/L (ref 3.5–5.3)
Sodium: 142 mmol/L (ref 135–146)
Total Bilirubin: 0.5 mg/dL (ref 0.2–1.2)
Total Protein: 6.6 g/dL (ref 6.1–8.1)

## 2019-06-18 NOTE — Progress Notes (Signed)
CBC is normal.  Glucose is mildly elevated probably not fasting.

## 2019-08-10 ENCOUNTER — Other Ambulatory Visit: Payer: Self-pay | Admitting: Rheumatology

## 2019-08-10 NOTE — Telephone Encounter (Signed)
Last Visit: 06/14/19 Next Visit: 10/14/19 Labs: 06/17/19 CBC is normal. Glucose is mildly elevated   Current Dose per office note on 06/14/19: methotrexate 4 tablets po once weekly   Okay to refill per Dr. Corliss Skains

## 2019-09-15 ENCOUNTER — Other Ambulatory Visit: Payer: Self-pay | Admitting: Rheumatology

## 2019-09-15 NOTE — Telephone Encounter (Signed)
Last Visit: 06/14/19 Next Visit: 10/14/19  Okay to refill per Dr. Corliss Skains

## 2019-10-01 NOTE — Progress Notes (Deleted)
Office Visit Note  Patient: [name]             Date of Birth: 05-06-1961           MRN: [id number]             PCP: Patient, No Pcp Per Referring: No ref. provider found Visit Date: 10/14/2019 Occupation: @GUAROCC @  Subjective:  No chief complaint on file.   History of Present Illness: [name] is a 59 y.o. female ***   Activities of Daily Living:  Patient reports morning stiffness for *** {minute/hour:19697}.   Patient {ACTIONS;DENIES/REPORTS:21021675::"Denies"} nocturnal pain.  Difficulty dressing/grooming: {ACTIONS;DENIES/REPORTS:21021675::"Denies"} Difficulty climbing stairs: {ACTIONS;DENIES/REPORTS:21021675::"Denies"} Difficulty getting out of chair: {ACTIONS;DENIES/REPORTS:21021675::"Denies"} Difficulty using hands for taps, buttons, cutlery, and/or writing: {ACTIONS;DENIES/REPORTS:21021675::"Denies"}  No Rheumatology ROS completed.   PMFS History:  Patient Active Problem List   Diagnosis Date Noted  . Pain in both hands 08/27/2016  . High risk medication use 08/27/2016  . Psoriatic arthritis (HCC) 08/27/2016  . History of obesity 08/27/2016  . Other psoriasis 08/27/2016  . Methotrexate, long term, current use 06/22/2016    Past Medical History:  Diagnosis Date  . Migraines   . Osteoarthritis   . Psoriasis   . Psoriatic arthritis (HCC)     Family History  Problem Relation Age of Onset  . Cancer Mother        Lung  . Heart disease Father   . Healthy Son   . Healthy Son    Past Surgical History:  Procedure Laterality Date  . CESAREAN SECTION  1995, 1998   Social History   Social History Narrative  . Not on file    There is no immunization history on file for this patient.   Objective: Vital Signs: LMP 07/15/2013    Physical Exam   Musculoskeletal Exam: ***  CDAI Exam: CDAI Score: -- Patient Global: --; Provider Global: -- Swollen: --; Tender: -- Joint Exam 10/14/2019   No joint exam has been documented for this  visit   There is currently no information documented on the homunculus. Go to the Rheumatology activity and complete the homunculus joint exam.  Investigation: No additional findings.  Imaging: No results found.  Recent Labs: Lab Results  Component Value Date   WBC 7.3 06/17/2019   HGB 13.1 06/17/2019   PLT 279 06/17/2019   NA 142 06/17/2019   K 4.4 06/17/2019   CL 105 06/17/2019   CO2 29 06/17/2019   GLUCOSE 110 (H) 06/17/2019   BUN 11 06/17/2019   CREATININE 0.74 06/17/2019   BILITOT 0.5 06/17/2019   ALKPHOS 108 01/10/2017   AST 16 06/17/2019   ALT 11 06/17/2019   PROT 6.6 06/17/2019   ALBUMIN 3.9 01/10/2017   CALCIUM 9.3 06/17/2019   GFRAA 105 06/17/2019    Speciality Comments: PLQ Eye Exam 05/07/17 WNL  Procedures:  No procedures performed Allergies: Penicillins   Assessment / Plan:     Visit Diagnoses: No diagnosis found.  Orders: No orders of the defined types were placed in this encounter.  No orders of the defined types were placed in this encounter.   Face-to-face time spent with patient was *** minutes. Greater than 50% of time was spent in counseling and coordination of care.  Follow-Up Instructions: No follow-ups on file.   05/09/17, PA-C  Note - This record has been created using Dragon software.  Chart creation errors have been sought, but may not always  have been located. Such creation errors  do not reflect on  the standard of medical care.

## 2019-10-14 ENCOUNTER — Ambulatory Visit: Payer: Self-pay | Admitting: Physician Assistant

## 2019-11-04 ENCOUNTER — Other Ambulatory Visit: Payer: Self-pay | Admitting: Rheumatology

## 2019-11-04 NOTE — Telephone Encounter (Addendum)
Last Visit: 06/14/19 Next Visit: 12/06/2019 Labs: 06/17/2019 CBC is normal. Glucose is mildly elevated   Current Dose per office note 06/14/2019: methotrexate 4 tablets po once weekly   Attempted to contact the patient and left message to advise patient she is due to update labs.   Okay to refill MTX?

## 2019-11-04 NOTE — Telephone Encounter (Signed)
Need labs prior to refill.

## 2019-11-08 ENCOUNTER — Other Ambulatory Visit: Payer: Self-pay

## 2019-11-08 DIAGNOSIS — Z79899 Other long term (current) drug therapy: Secondary | ICD-10-CM

## 2019-11-09 ENCOUNTER — Other Ambulatory Visit: Payer: Self-pay | Admitting: *Deleted

## 2019-11-09 LAB — CBC WITH DIFFERENTIAL/PLATELET
Absolute Monocytes: 661 cells/uL (ref 200–950)
Basophils Absolute: 52 cells/uL (ref 0–200)
Basophils Relative: 0.6 %
Eosinophils Absolute: 218 cells/uL (ref 15–500)
Eosinophils Relative: 2.5 %
HCT: 39.5 % (ref 35.0–45.0)
Hemoglobin: 13.4 g/dL (ref 11.7–15.5)
Lymphs Abs: 2453 cells/uL (ref 850–3900)
MCH: 32.1 pg (ref 27.0–33.0)
MCHC: 33.9 g/dL (ref 32.0–36.0)
MCV: 94.7 fL (ref 80.0–100.0)
MPV: 10 fL (ref 7.5–12.5)
Monocytes Relative: 7.6 %
Neutro Abs: 5316 cells/uL (ref 1500–7800)
Neutrophils Relative %: 61.1 %
Platelets: 301 10*3/uL (ref 140–400)
RBC: 4.17 10*6/uL (ref 3.80–5.10)
RDW: 14 % (ref 11.0–15.0)
Total Lymphocyte: 28.2 %
WBC: 8.7 10*3/uL (ref 3.8–10.8)

## 2019-11-09 LAB — COMPLETE METABOLIC PANEL WITH GFR
AG Ratio: 1.3 (calc) (ref 1.0–2.5)
ALT: 11 U/L (ref 6–29)
AST: 15 U/L (ref 10–35)
Albumin: 3.5 g/dL — ABNORMAL LOW (ref 3.6–5.1)
Alkaline phosphatase (APISO): 89 U/L (ref 37–153)
BUN: 13 mg/dL (ref 7–25)
CO2: 29 mmol/L (ref 20–32)
Calcium: 9.1 mg/dL (ref 8.6–10.4)
Chloride: 105 mmol/L (ref 98–110)
Creat: 0.75 mg/dL (ref 0.50–1.05)
GFR, Est African American: 103 mL/min/{1.73_m2} (ref >=60)
GFR, Est Non African American: 89 mL/min/{1.73_m2} (ref >=60)
Globulin: 2.8 g/dL (calc) (ref 1.9–3.7)
Glucose, Bld: 116 mg/dL — ABNORMAL HIGH (ref 65–99)
Potassium: 4.5 mmol/L (ref 3.5–5.3)
Sodium: 140 mmol/L (ref 135–146)
Total Bilirubin: 0.6 mg/dL (ref 0.2–1.2)
Total Protein: 6.3 g/dL (ref 6.1–8.1)

## 2019-11-09 MED ORDER — METHOTREXATE 2.5 MG PO TABS: 10.0000 mg | ORAL_TABLET | ORAL | 0 refills | Status: DC

## 2019-11-09 NOTE — Telephone Encounter (Signed)
Patient requested a refill on MTX  Last Visit: 06/14/2019 Next Visit: 12/06/2019 Labs: 11/08/2019 Glucose is 116. Albumin is borderline low. We will continue to monitor. Rest of CMP WNL. CBC WNL.  Current Dose per office note 06/14/2019: Methotrexate 4 tablets po once weekly  DX: Psoriatic arthritis   Okay to refill MTX?

## 2019-11-23 NOTE — Progress Notes (Signed)
Office Visit Note  Patient: [name]             Date of Birth: [birth year]           MRN: [id number]             PCP: Patient, No Pcp Per Referring: No ref. provider found Visit Date: 12/06/2019 Occupation: @GUAROCC @  Subjective:  Trochanteric bursitis of the left hip   History of Present Illness: [name] is a 59 y.o. female with history of psoriatic arthritis.  Patient is on methotrexate 4 tablets by mouth once weekly and folic acid 1 mg by mouth daily.  She has not missed any doses of methotrexate recently.  She has not had any recent infections.  She has received both COVID-19 vaccinations.  According to the patient she held methotrexate 1 week after her second vaccine.  Patient denies any recent rheumatoid arthritis flares.  She denies any joint pain or joint swelling at this time.  She has morning stiffness lasting at 5 minutes.  She reports that she has been experiencing increased discomfort due to left trochanter bursitis.  She states that she has been walking 17,000-20,000 steps per day and has stopped performing stretching exercises and yoga which she feels is exacerbated her discomfort.  She has occasional nocturnal pain when laying on her left side.  She denies any SI joint pain at this time.  She denies any Achilles tendinitis or plantar fasciitis.  She has not had any psoriasis recently.      Activities of Daily Living:  Patient reports morning stiffness for 5  minutes.   Patient Reports nocturnal pain.  Difficulty dressing/grooming: Denies Difficulty climbing stairs: Denies Difficulty getting out of chair: Denies Difficulty using hands for taps, buttons, cutlery, and/or writing: Denies  Review of Systems  Constitutional: Negative for fatigue.  HENT: Negative for mouth sores, mouth dryness and nose dryness.   Eyes: Negative for pain, visual disturbance and dryness.  Respiratory: Negative for cough, hemoptysis, shortness of breath and difficulty  breathing.   Cardiovascular: Negative for chest pain, palpitations, hypertension and swelling in legs/feet.  Gastrointestinal: Negative for blood in stool, constipation and diarrhea.  Endocrine: Negative for increased urination.  Genitourinary: Negative for painful urination.  Musculoskeletal: Positive for morning stiffness. Negative for arthralgias, joint pain, joint swelling, myalgias, muscle weakness, muscle tenderness and myalgias.  Skin: Negative for color change, pallor, rash, hair loss, nodules/bumps, skin tightness, ulcers and sensitivity to sunlight.  Allergic/Immunologic: Negative for susceptible to infections.  Neurological: Negative for dizziness, numbness, headaches and weakness.  Hematological: Negative for swollen glands.  Psychiatric/Behavioral: Negative for depressed mood and sleep disturbance. The patient is not nervous/anxious.     PMFS History:  Patient Active Problem List   Diagnosis Date Noted  . Pain in both hands 08/27/2016  . High risk medication use 08/27/2016  . Psoriatic arthritis (HCC) 08/27/2016  . History of obesity 08/27/2016  . Other psoriasis 08/27/2016  . Methotrexate, long term, current use 06/22/2016    Past Medical History:  Diagnosis Date  . Migraines   . Osteoarthritis   . Psoriasis   . Psoriatic arthritis (HCC)     Family History  Problem Relation Age of Onset  . Cancer Mother        Lung  . Heart disease Father   . Healthy Son   . Healthy Son    Past Surgical History:  Procedure Laterality Date  . CESAREAN SECTION  1995, 1998   Social  History   Social History Narrative  . Not on file    There is no immunization history on file for this patient.   Objective: Vital Signs: Resp 14   Ht 5\' 3"  (1.6 m)   LMP 07/15/2013   BMI 34.12 kg/m    Physical Exam Vitals and nursing note reviewed.  Constitutional:      Appearance: She is well-developed.  HENT:     Head: Normocephalic and atraumatic.  Eyes:     Conjunctiva/sclera:  Conjunctivae normal.  Pulmonary:     Effort: Pulmonary effort is normal.  Abdominal:     General: Bowel sounds are normal.     Palpations: Abdomen is soft.  Musculoskeletal:     Cervical back: Normal range of motion.  Lymphadenopathy:     Cervical: No cervical adenopathy.  Skin:    General: Skin is warm and dry.     Capillary Refill: Capillary refill takes less than 2 seconds.  Neurological:     Mental Status: She is alert and oriented to person, place, and time.  Psychiatric:        Behavior: Behavior normal.      Musculoskeletal Exam: C-spine limited ROM with lateral rotation.  Thoracic spine and lumbar spine have good range of motion.  No midline spinal tenderness.  No SI joint tenderness.  Shoulder joints, elbow joints, wrist joints, MCPs, PIPs, DIPs have good range of motion with no synovitis.  Mild PIP and DIP thickening consistent with osteoarthritis of both hands. She has complete fist formation bilaterally.  Hip joints, knee joints, ankle joints, MTPs, PIPs, DIPs have good range of motion with no synovitis.  No warmth or effusion of bilateral knee joints.  No tenderness or swelling of ankle joints.  No Achilles tendinitis or plantar fasciitis.  No tenderness of MTP joints.  She has mild PIP and DIP thickening consistent with osteoarthritis of both feet.  CDAI Exam: CDAI Score: -- Patient Global: --; Provider Global: -- Swollen: --; Tender: -- Joint Exam 12/06/2019   No joint exam has been documented for this visit   There is currently no information documented on the homunculus. Go to the Rheumatology activity and complete the homunculus joint exam.  Investigation: No additional findings.  Imaging: No results found.  Recent Labs: Lab Results  Component Value Date   WBC 8.7 11/08/2019   HGB 13.4 11/08/2019   PLT 301 11/08/2019   NA 140 11/08/2019   K 4.5 11/08/2019   CL 105 11/08/2019   CO2 29 11/08/2019   GLUCOSE 116 (H) 11/08/2019   BUN 13 11/08/2019    CREATININE 0.75 11/08/2019   BILITOT 0.6 11/08/2019   ALKPHOS 108 01/10/2017   AST 15 11/08/2019   ALT 11 11/08/2019   PROT 6.3 11/08/2019   ALBUMIN 3.9 01/10/2017   CALCIUM 9.1 11/08/2019   GFRAA 103 11/08/2019    Speciality Comments: PLQ Eye Exam 05/07/17 WNL  Procedures:  No procedures performed Allergies: Penicillins   Assessment / Plan:     Visit Diagnoses: Psoriatic arthritis (HCC): She has no synovitis or dactylitis on exam.  She has not had any recent psoriatic arthritis flares.  She is clinically doing well on methotrexate 4 tablets by mouth once weekly and folic acid 1 mg by mouth daily.  She has not missed any doses of methotrexate recently.  She has not had any recent infections.  She experiences morning stiffness lasting about 5 minutes but has not had any increased pain or swelling recently.  She  has not had any Achilles tendinitis or plantar fasciitis.  No SI joint tenderness was noted on exam.  She has not had any active psoriasis recently.  She will continue on methotrexate and folic acid as prescribed.  She does not need any refills at this time.  She is advised to notify us if she develops increased joint pain or joint swelling.  She will follow-up in the office in 5 months.  Other psoriasis: She has no active psoriasis at this time.  High risk medication use - Methotrexate 4 tablets by mouth every week and folic acid 1 mg by mouth daily.  CBC and CMP were drawn on 11/08/2019.  Lab work was reviewed today in the office with the patient.  She is due to update lab work in September and every 3 months to monitor for drug toxicity.  Standing orders for CBC and CMP are in place. She has received both COVID-19 vaccinations.  She held methotrexate 1 week after the second vaccine.  We discussed that ideally she would have held methotrexate 1 week after each vaccine per ACR guidelines.  She was encouraged to continue to wear a mask and social distance.  She voiced understanding.  She  is aware that she is to hold methotrexate if she develops signs or symptoms of an infection and to resume once the infection has completely cleared.  Trochanteric bursitis, left hip - She has tenderness palpation over the left trochanteric bursa.  She has been experiencing increased discomfort which she feels is exacerbated by walking prolonged distances and not performing stretching exercises like she used to.  She has been trying to walk 17,000-20,000 steps per day and has no longer been stretching or performing yoga exercises.  She was encouraged to resume her stretching exercise regimen.  We discussed that physical therapy is an option but she declined at this time.  She was advised to notify us if her symptoms persist or worsen.  Left hand paresthesia - Resolved   Postmenopausal -Order for DEXA placed today. Plan: DG BONE DENSITY (DXA)  Osteoporosis screening -Order for DEXA placed today.  Plan: DG BONE DENSITY (DXA)  Orders: Orders Placed This Encounter  Procedures  . DG BONE DENSITY (DXA)   No orders of the defined types were placed in this encounter.    Follow-Up Instructions: Return in about 5 months (around 05/07/2020) for Psoriatic arthritis.   Gearldine Bienenstock, PA-C  Note - This record has been created using Dragon software.  Chart creation errors have been sought, but may not always  have been located. Such creation errors do not reflect on  the standard of medical care.

## 2019-12-06 ENCOUNTER — Ambulatory Visit: Payer: BC Managed Care – PPO | Admitting: Physician Assistant

## 2019-12-06 ENCOUNTER — Other Ambulatory Visit: Payer: Self-pay

## 2019-12-06 ENCOUNTER — Encounter: Payer: Self-pay | Admitting: Physician Assistant

## 2019-12-06 VITALS — BP 104/61 | HR 53 | Resp 14 | Ht 63.0 in | Wt 186.6 lb

## 2019-12-06 DIAGNOSIS — Z780 Asymptomatic menopausal state: Secondary | ICD-10-CM

## 2019-12-06 DIAGNOSIS — L4050 Arthropathic psoriasis, unspecified: Secondary | ICD-10-CM | POA: Diagnosis not present

## 2019-12-06 DIAGNOSIS — L408 Other psoriasis: Secondary | ICD-10-CM

## 2019-12-06 DIAGNOSIS — Z13820 Encounter for screening for osteoporosis: Secondary | ICD-10-CM

## 2019-12-06 DIAGNOSIS — M7062 Trochanteric bursitis, left hip: Secondary | ICD-10-CM | POA: Diagnosis not present

## 2019-12-06 DIAGNOSIS — R202 Paresthesia of skin: Secondary | ICD-10-CM

## 2019-12-06 DIAGNOSIS — Z79899 Other long term (current) drug therapy: Secondary | ICD-10-CM

## 2019-12-06 NOTE — Patient Instructions (Signed)
Standing Labs We placed an order today for your standing lab work.   Please have your standing labs drawn in September and every 3 months   If possible, please have your labs drawn 2 weeks prior to your appointment so that the provider can discuss your results at your appointment.  We have open lab daily Monday through Thursday from 8:30-12:30 PM and 1:30-4:30 PM and Friday from 8:30-12:30 PM and 1:30-4:00 PM at the office of Dr. Shaili Deveshwar, [facility].   Please be advised, patients with office appointments requiring lab work will take precedents over walk-in lab work.  If possible, please come for your lab work on Monday and Friday afternoons, as you may experience shorter wait times. The office is located at [address], [address], [place], [place] [address] No appointment is necessary.   Labs are drawn by [facility]. Please bring your co-pay at the time of your lab draw.  You may receive a bill from [facility] for your lab work.  If you wish to have your labs drawn at another location, please call the office 24 hours in advance to send orders.  If you have any questions regarding directions or hours of operation,  please call [phone].   As a reminder, please drink plenty of water prior to coming for your lab work. Thanks!    Vaccines You are taking a medication(s) that can suppress your immune system.  The following immunizations are recommended: . Flu annually . Covid-19  o Please continue to wear your mask if you are on any medications that suppress your immune system o If you are on methotrexate, Cellcept (mycophenolate), Rinvoq, Xeljanz, and Olumiant - Hold medication for 1 week after each vaccine dose o If you are on Orencia Subcutaneous injections - Hold medication both one week prior to and one week after the first vaccine dose (only) o If you are on Orencia IV infusions - Time vaccine administration so that the first vaccination will occur four weeks  after infusion . Pneumonia (Pneumovax 23 and Prevnar 13 spaced at least 1 year apart) . Shingrix (after age 50)  Please check with your PCP to make sure you are up to date.  

## 2019-12-09 ENCOUNTER — Encounter: Payer: Self-pay | Admitting: Physician Assistant

## 2019-12-10 ENCOUNTER — Telehealth: Payer: Self-pay | Admitting: *Deleted

## 2019-12-10 NOTE — Telephone Encounter (Signed)
Patient advised of results and recommendations.  

## 2019-12-10 NOTE — Telephone Encounter (Signed)
Received DEXA results from [facility].  Date of Scan: 12/09/2019 Lowest T-score and site measured: -0.6 Left Femoral Hip Significant changes in BMD and site measured (5% and above):n/a  Current Regimen: n/a  Recommendation: Repeat in 5 years.

## 2020-01-27 ENCOUNTER — Other Ambulatory Visit: Payer: Self-pay | Admitting: Rheumatology

## 2020-01-27 NOTE — Telephone Encounter (Signed)
Last Visit: 12/06/2019 Next Visit: 05/16/2020 Labs: 11/08/2019 Glucose is 116. Albumin is borderline low. Rest of CMP WNL. CBC WNL.  Current Dose per office note 12/06/2019: Methotrexate 4 tablets by mouth every week  DX: Psoriatic arthritis  Patient advised she is due for labs - patient plans to come in either tomorrow or one day next week.  Okay to refill Methotrexate?

## 2020-02-07 ENCOUNTER — Other Ambulatory Visit: Payer: Self-pay

## 2020-02-07 DIAGNOSIS — Z79899 Other long term (current) drug therapy: Secondary | ICD-10-CM

## 2020-02-07 LAB — COMPLETE METABOLIC PANEL WITH GFR
AG Ratio: 1.5 (calc) (ref 1.0–2.5)
ALT: 11 U/L (ref 6–29)
AST: 16 U/L (ref 10–35)
Albumin: 3.8 g/dL (ref 3.6–5.1)
Alkaline phosphatase (APISO): 95 U/L (ref 37–153)
BUN: 13 mg/dL (ref 7–25)
CO2: 28 mmol/L (ref 20–32)
Calcium: 9.1 mg/dL (ref 8.6–10.4)
Chloride: 103 mmol/L (ref 98–110)
Creat: 0.84 mg/dL (ref 0.50–1.05)
GFR, Est African American: 90 mL/min/{1.73_m2} (ref >=60)
GFR, Est Non African American: 78 mL/min/{1.73_m2} (ref >=60)
Globulin: 2.6 g/dL (calc) (ref 1.9–3.7)
Glucose, Bld: 94 mg/dL (ref 65–99)
Potassium: 4.7 mmol/L (ref 3.5–5.3)
Sodium: 139 mmol/L (ref 135–146)
Total Bilirubin: 0.5 mg/dL (ref 0.2–1.2)
Total Protein: 6.4 g/dL (ref 6.1–8.1)

## 2020-02-07 LAB — CBC WITH DIFFERENTIAL/PLATELET
Absolute Monocytes: 638 cells/uL (ref 200–950)
Basophils Absolute: 51 cells/uL (ref 0–200)
Basophils Relative: 0.6 %
Eosinophils Absolute: 204 cells/uL (ref 15–500)
Eosinophils Relative: 2.4 %
HCT: 40.4 % (ref 35.0–45.0)
Hemoglobin: 13.4 g/dL (ref 11.7–15.5)
Lymphs Abs: 3001 cells/uL (ref 850–3900)
MCH: 32.8 pg (ref 27.0–33.0)
MCHC: 33.2 g/dL (ref 32.0–36.0)
MCV: 98.8 fL (ref 80.0–100.0)
MPV: 10.4 fL (ref 7.5–12.5)
Monocytes Relative: 7.5 %
Neutro Abs: 4607 cells/uL (ref 1500–7800)
Neutrophils Relative %: 54.2 %
Platelets: 305 10*3/uL (ref 140–400)
RBC: 4.09 10*6/uL (ref 3.80–5.10)
RDW: 13.3 % (ref 11.0–15.0)
Total Lymphocyte: 35.3 %
WBC: 8.5 10*3/uL (ref 3.8–10.8)

## 2020-03-11 ENCOUNTER — Ambulatory Visit: Payer: BC Managed Care – PPO | Attending: Internal Medicine

## 2020-03-11 DIAGNOSIS — Z23 Encounter for immunization: Secondary | ICD-10-CM

## 2020-03-11 NOTE — Progress Notes (Signed)
.    Covid-19 Vaccination Clinic  Name:  [name]    MRN: [id number] DOB: April 22, 1961  03/11/2020  [name] was observed post Covid-19 immunization for 15 minutes without incident. She was provided with Vaccine Information Sheet and instruction to access the V-Safe system.   [name] was instructed to call 911 with any severe reactions post vaccine: Marland Kitchen Difficulty breathing  . Swelling of face and throat  . A fast heartbeat  . A bad rash all over body  . Dizziness and weakness

## 2020-03-16 DIAGNOSIS — Z1231 Encounter for screening mammogram for malignant neoplasm of breast: Secondary | ICD-10-CM

## 2020-04-17 ENCOUNTER — Other Ambulatory Visit: Payer: Self-pay | Admitting: Physician Assistant

## 2020-04-17 DIAGNOSIS — Z1231 Encounter for screening mammogram for malignant neoplasm of breast: Secondary | ICD-10-CM

## 2020-04-23 ENCOUNTER — Other Ambulatory Visit: Payer: Self-pay | Admitting: Rheumatology

## 2020-04-24 NOTE — Telephone Encounter (Signed)
Last Visit: 12/06/2019 Next Visit: 05/16/2020 Labs: 02/07/2020 CBC and CMP WNL.  Current Dose per office note 12/06/2019: Methotrexate 4 tablets by mouth every week DX: Psoriatic arthritis  Okay to refill per Dr. Corliss Skains

## 2020-04-26 ENCOUNTER — Other Ambulatory Visit: Payer: Self-pay

## 2020-04-26 ENCOUNTER — Ambulatory Visit
Admission: RE | Admit: 2020-04-26 | Discharge: 2020-04-26 | Disposition: A | Discharge status: Home / Self Care | Payer: BC Managed Care – PPO | Source: Ambulatory Visit

## 2020-04-26 DIAGNOSIS — Z1231 Encounter for screening mammogram for malignant neoplasm of breast: Secondary | ICD-10-CM

## 2020-05-02 NOTE — Progress Notes (Signed)
I did not place this order.  Please forward to gynecologist and notify the breast center.

## 2020-05-16 ENCOUNTER — Ambulatory Visit: Payer: BC Managed Care – PPO | Admitting: Physician Assistant

## 2020-06-01 NOTE — Progress Notes (Signed)
Office Visit Note  Patient: [name]             Date of Birth: February 05, 1961           MRN: [id number]             PCP: Patient, No Pcp Per Referring: No ref. provider found Visit Date: 06/15/2020 Occupation: @GUAROCC @  Subjective:  Medication monitoring   History of Present Illness: [name] is a 60 y.o. female with history of psoriatic arthritis.  She is currently taking methotrexate 4 tablets by mouth once weekly and folic acid 1 mg po daily. She is tolerating methotrexate without any side effects. She denies any recent psoriatic arthritis flares. She states that she received the third Pfizer COVID-19 vaccination on 03/11/2020. She states that she had methotrexate for about 1 month after receiving a dose. She states that she was trying to allow her immune system have a more robust response and to also test how long she could go off methotrexate without having a flare. She states that at the 1 month mark she started to experience increased arthralgias and joint stiffness but did not notice any joint swelling. She has not noticed any active psoriasis. She denies any Achilles tendinitis or plantar fasciitis. She denies any SI joint tenderness at this time. She has not had any increased nocturnal pain.       Activities of Daily Living:  Patient reports morning stiffness for 10 minutes.   Patient Denies nocturnal pain.  Difficulty dressing/grooming: Denies Difficulty climbing stairs: Denies Difficulty getting out of chair: Denies Difficulty using hands for taps, buttons, cutlery, and/or writing: Denies  Review of Systems  Constitutional: Negative for fatigue.  HENT: Negative for mouth sores, mouth dryness and nose dryness.   Eyes: Negative for pain, itching and dryness.  Respiratory: Negative for shortness of breath and difficulty breathing.   Cardiovascular: Negative for chest pain and palpitations.  Gastrointestinal: Negative for blood in stool, constipation and  diarrhea.  Endocrine: Negative for increased urination.  Genitourinary: Negative for difficulty urinating.  Musculoskeletal: Positive for arthralgias, joint pain and morning stiffness. Negative for joint swelling, myalgias, muscle tenderness and myalgias.  Skin: Negative for color change, rash and redness.  Allergic/Immunologic: Negative for susceptible to infections.  Neurological: Negative for dizziness, numbness, headaches, memory loss and weakness.  Hematological: Negative for bruising/bleeding tendency.  Psychiatric/Behavioral: Negative for confusion.    PMFS History:  Patient Active Problem List   Diagnosis Date Noted  . Pain in both hands 08/27/2016  . High risk medication use 08/27/2016  . Psoriatic arthritis (HCC) 08/27/2016  . History of obesity 08/27/2016  . Other psoriasis 08/27/2016  . Methotrexate, long term, current use 06/22/2016    Past Medical History:  Diagnosis Date  . Migraines   . Osteoarthritis   . Psoriasis   . Psoriatic arthritis (HCC)     Family History  Problem Relation Age of Onset  . Cancer Mother        Lung  . Heart disease Father   . Healthy Son   . Healthy Son    Past Surgical History:  Procedure Laterality Date  . CESAREAN SECTION  1995, 1998   Social History   Social History Narrative  . Not on file   Immunization History  Administered Date(s) Administered  . PFIZER(Purple Top)SARS-COV-2 Vaccination 07/09/2019, 07/30/2019, 03/11/2020     Objective: Vital Signs: BP 104/77 (BP Location: Left Arm, Patient Position: Sitting, Cuff Size: Normal)   Pulse 60  Resp 15   Ht 5\' 3"  (1.6 m)   Wt 196 lb (88.9 kg)   LMP 07/15/2013   BMI 34.72 kg/m    Physical Exam Vitals and nursing note reviewed.  Constitutional:      Appearance: She is well-developed and well-nourished.  HENT:     Head: Normocephalic and atraumatic.  Eyes:     Extraocular Movements: EOM normal.     Conjunctiva/sclera: Conjunctivae normal.  Cardiovascular:      Pulses: Intact distal pulses.  Pulmonary:     Effort: Pulmonary effort is normal.  Abdominal:     Palpations: Abdomen is soft.  Musculoskeletal:     Cervical back: Normal range of motion.  Skin:    General: Skin is warm and dry.     Capillary Refill: Capillary refill takes less than 2 seconds.  Neurological:     Mental Status: She is alert and oriented to person, place, and time.  Psychiatric:        Mood and Affect: Mood and affect normal.        Behavior: Behavior normal.      Musculoskeletal Exam: C-spine, thoracic spine, lumbar spine have good range of motion with no discomfort.  No midline spinal tenderness.  No SI joint tenderness.  Shoulder joints, elbow joints, wrist joints, MCPs, PIPs, DIPs have good range of motion with no synovitis.  She is able to make a complete fist bilaterally.  Hip joints have good range of motion with no discomfort.  Knee joints have good range of motion with no warmth or effusion.  Ankle joints have good range of motion with no tenderness or inflammation.  CDAI Exam: CDAI Score: -- Patient Global: --; Provider Global: -- Swollen: --; Tender: -- Joint Exam 06/15/2020   No joint exam has been documented for this visit   There is currently no information documented on the homunculus. Go to the Rheumatology activity and complete the homunculus joint exam.  Investigation: No additional findings.  Imaging: No results found.  Recent Labs: Lab Results  Component Value Date   WBC 8.5 02/07/2020   HGB 13.4 02/07/2020   PLT 305 02/07/2020   NA 139 02/07/2020   K 4.7 02/07/2020   CL 103 02/07/2020   CO2 28 02/07/2020   GLUCOSE 94 02/07/2020   BUN 13 02/07/2020   CREATININE 0.84 02/07/2020   BILITOT 0.5 02/07/2020   ALKPHOS 108 01/10/2017   AST 16 02/07/2020   ALT 11 02/07/2020   PROT 6.4 02/07/2020   ALBUMIN 3.9 01/10/2017   CALCIUM 9.1 02/07/2020   GFRAA 90 02/07/2020    Speciality Comments: PLQ Eye Exam 05/07/17 WNL  Procedures:   No procedures performed Allergies: Penicillins   Assessment / Plan:     Visit Diagnoses: Psoriatic arthritis (HCC): She has no synovitis or dactylitis on exam.  She has not had any recent psoriatic arthritis flares.  She is clinically doing well taking methotrexate 4 tablets by mouth once weekly and folic acid 1 mg by mouth daily.  She missed 1 month of methotrexate after receiving her third Pfizer COVID-19 vaccination on 03/11/2020.  She noticed increased arthralgias and joint stiffness around the 1 month mark and resume methotrexate as prescribed.  Her joint pain and stiffness improved after about 2 weeks.  She has not had any Achilles tendinitis or plantar fasciitis.  She has no SI joint tenderness on exam.  She has no active psoriasis currently.  She will continue taking methotrexate and folic acid as prescribed.  We discussed the importance of regular exercise.  She does not need any refills at this time.  She was advised to notify us if she develops increased joint pain or joint swelling.  She will follow-up in the office in 5 months.  Other psoriasis: She has no active psoriasis at this time.   High risk medication use - Methotrexate 4 tablets by mouth every week and folic acid 1 mg by mouth daily.CBC and CMP updated on 02/07/20.  She is due to update lab work.  Orders for CBC and CMP were placed.  Her next lab work will be due in May and every 3 months to monitor for drug toxicity.  Standing orders for CBC and CMP were placed today.   Plan: CBC with Differential/Platelet, COMPLETE METABOLIC PANEL WITH GFR, CBC with Differential/Platelet, COMPLETE METABOLIC PANEL WITH GFR  She has not had any recent infections. She was advised to hold methotrexate if she develops signs or symptoms of an infection and to resume once the infection has completely cleared. She has received 3 Pfizer COVID-19 vaccinations. She was encouraged to receive the fourth dose 5 months after her last dose. She is advised to hold  methotrexate 1 week after receiving the fourth dose. She is advised to notify us or PCP if she develops a COVID-19 infection in order to try to receive the monoclonal antibody infusion.    -  Trochanteric bursitis, left hip: Resolved   Left hand paresthesia - Resolved   Osteoporosis screening - DEXA 12/09/2019 T-score: -0.6 BMD: 0.785  Orders: Orders Placed This Encounter  Procedures  . CBC with Differential/Platelet  . COMPLETE METABOLIC PANEL WITH GFR  . CBC with Differential/Platelet  . COMPLETE METABOLIC PANEL WITH GFR   No orders of the defined types were placed in this encounter.   Follow-Up Instructions: Return in about 5 months (around 11/12/2020) for Psoriatic arthritis.   Gearldine Bienenstock, PA-C  Note - This record has been created using Dragon software.  Chart creation errors have been sought, but may not always  have been located. Such creation errors do not reflect on  the standard of medical care.

## 2020-06-15 ENCOUNTER — Encounter: Payer: Self-pay | Admitting: Physician Assistant

## 2020-06-15 ENCOUNTER — Other Ambulatory Visit: Payer: Self-pay

## 2020-06-15 ENCOUNTER — Ambulatory Visit: Payer: BC Managed Care – PPO | Admitting: Physician Assistant

## 2020-06-15 VITALS — BP 104/77 | HR 60 | Resp 15 | Ht 63.0 in | Wt 196.0 lb

## 2020-06-15 DIAGNOSIS — R202 Paresthesia of skin: Secondary | ICD-10-CM

## 2020-06-15 DIAGNOSIS — L408 Other psoriasis: Secondary | ICD-10-CM | POA: Diagnosis not present

## 2020-06-15 DIAGNOSIS — Z79899 Other long term (current) drug therapy: Secondary | ICD-10-CM

## 2020-06-15 DIAGNOSIS — L4050 Arthropathic psoriasis, unspecified: Secondary | ICD-10-CM

## 2020-06-15 DIAGNOSIS — M7062 Trochanteric bursitis, left hip: Secondary | ICD-10-CM | POA: Diagnosis not present

## 2020-06-15 DIAGNOSIS — Z13820 Encounter for screening for osteoporosis: Secondary | ICD-10-CM

## 2020-06-15 NOTE — Patient Instructions (Signed)
Standing Labs We placed an order today for your standing lab work.   Please have your standing labs drawn in May and every 3 months   If possible, please have your labs drawn 2 weeks prior to your appointment so that the provider can discuss your results at your appointment.  We have open lab daily Monday through Thursday from 8:30-12:30 PM and 1:30-4:30 PM and Friday from 8:30-12:30 PM and 1:30-4:00 PM at the office of Dr. Shaili Deveshwar, [facility].   Please be advised, all patients with office appointments requiring lab work will take precedents over walk-in lab work.  If possible, please come for your lab work on Monday and Friday afternoons, as you may experience shorter wait times. The office is located at [address], [address], [place], [place] [address] No appointment is necessary.   Labs are drawn by [facility]. Please bring your co-pay at the time of your lab draw.  You may receive a bill from [facility] for your lab work.  If you wish to have your labs drawn at another location, please call the office 24 hours in advance to send orders.  If you have any questions regarding directions or hours of operation,  please call [phone].   As a reminder, please drink plenty of water prior to coming for your lab work. Thanks!   

## 2020-06-16 LAB — CBC WITH DIFFERENTIAL/PLATELET
Absolute Monocytes: 768 cells/uL (ref 200–950)
Basophils Absolute: 64 cells/uL (ref 0–200)
Basophils Relative: 0.8 %
Eosinophils Absolute: 208 cells/uL (ref 15–500)
Eosinophils Relative: 2.6 %
HCT: 38.2 % (ref 35.0–45.0)
Hemoglobin: 13 g/dL (ref 11.7–15.5)
Lymphs Abs: 3048 cells/uL (ref 850–3900)
MCH: 32.5 pg (ref 27.0–33.0)
MCHC: 34 g/dL (ref 32.0–36.0)
MCV: 95.5 fL (ref 80.0–100.0)
MPV: 10.4 fL (ref 7.5–12.5)
Monocytes Relative: 9.6 %
Neutro Abs: 3912 cells/uL (ref 1500–7800)
Neutrophils Relative %: 48.9 %
Platelets: 286 10*3/uL (ref 140–400)
RBC: 4 10*6/uL (ref 3.80–5.10)
RDW: 14.4 % (ref 11.0–15.0)
Total Lymphocyte: 38.1 %
WBC: 8 10*3/uL (ref 3.8–10.8)

## 2020-06-16 LAB — COMPLETE METABOLIC PANEL WITH GFR
AG Ratio: 1.4 (calc) (ref 1.0–2.5)
ALT: 10 U/L (ref 6–29)
AST: 16 U/L (ref 10–35)
Albumin: 3.9 g/dL (ref 3.6–5.1)
Alkaline phosphatase (APISO): 99 U/L (ref 37–153)
BUN: 17 mg/dL (ref 7–25)
CO2: 28 mmol/L (ref 20–32)
Calcium: 9 mg/dL (ref 8.6–10.4)
Chloride: 104 mmol/L (ref 98–110)
Creat: 0.72 mg/dL (ref 0.50–1.05)
GFR, Est African American: 108 mL/min/{1.73_m2} (ref >=60)
GFR, Est Non African American: 93 mL/min/{1.73_m2} (ref >=60)
Globulin: 2.8 g/dL (calc) (ref 1.9–3.7)
Glucose, Bld: 95 mg/dL (ref 65–99)
Potassium: 5 mmol/L (ref 3.5–5.3)
Sodium: 140 mmol/L (ref 135–146)
Total Bilirubin: 0.4 mg/dL (ref 0.2–1.2)
Total Protein: 6.7 g/dL (ref 6.1–8.1)

## 2020-06-16 NOTE — Progress Notes (Signed)
CBC and CMP WNL

## 2020-07-23 ENCOUNTER — Other Ambulatory Visit: Payer: Self-pay | Admitting: Rheumatology

## 2020-07-24 NOTE — Telephone Encounter (Signed)
Next Visit: 11/16/2020  Last Visit: 06/15/2020  Last Fill: 04/24/2020  DX: Psoriatic arthritis   Current Dose per office note 06/15/2020, Methotrexate 4 tablets by mouth every week   Labs: 06/15/2020, CBC and CMP WNL  Okay to refill MTX

## 2020-09-25 ENCOUNTER — Other Ambulatory Visit: Payer: Self-pay | Admitting: Rheumatology

## 2020-09-25 NOTE — Telephone Encounter (Signed)
Next Visit: 11/16/2020  Last Visit: 06/14/2020  Last Fill: 09/15/2019  Dx:  Psoriatic arthritis   Current Dose per office note on 06/15/2020, folic acid 1 mg by mouth daily  Okay to refill folic acid?

## 2020-10-15 ENCOUNTER — Other Ambulatory Visit: Payer: Self-pay | Admitting: Physician Assistant

## 2020-10-16 NOTE — Telephone Encounter (Signed)
Next Visit: 11/16/2020  Last Visit: 06/14/2020  Last Fill: 07/24/2020  Dx: Psoriatic arthritis   Current Dose per office note on 06/15/2020, Methotrexate 4 tablets by mouth every week   Labs: 06/15/2020 CBC and CMP WNL  Patient advised she is due to update labs. Patient states she will update on 10/18/2020.  Okay to refill MTX?

## 2020-10-25 ENCOUNTER — Other Ambulatory Visit: Payer: Self-pay

## 2020-10-25 DIAGNOSIS — Z79899 Other long term (current) drug therapy: Secondary | ICD-10-CM

## 2020-10-26 LAB — CBC WITH DIFFERENTIAL/PLATELET
Absolute Monocytes: 800 cells/uL (ref 200–950)
Basophils Absolute: 46 cells/uL (ref 0–200)
Basophils Relative: 0.5 %
Eosinophils Absolute: 166 cells/uL (ref 15–500)
Eosinophils Relative: 1.8 %
HCT: 40 % (ref 35.0–45.0)
Hemoglobin: 13.4 g/dL (ref 11.7–15.5)
Lymphs Abs: 2383 cells/uL (ref 850–3900)
MCH: 33.1 pg — ABNORMAL HIGH (ref 27.0–33.0)
MCHC: 33.5 g/dL (ref 32.0–36.0)
MCV: 98.8 fL (ref 80.0–100.0)
MPV: 9.7 fL (ref 7.5–12.5)
Monocytes Relative: 8.7 %
Neutro Abs: 5805 cells/uL (ref 1500–7800)
Neutrophils Relative %: 63.1 %
Platelets: 279 10*3/uL (ref 140–400)
RBC: 4.05 10*6/uL (ref 3.80–5.10)
RDW: 14.2 % (ref 11.0–15.0)
Total Lymphocyte: 25.9 %
WBC: 9.2 10*3/uL (ref 3.8–10.8)

## 2020-10-26 LAB — COMPLETE METABOLIC PANEL WITH GFR
AG Ratio: 1.3 (calc) (ref 1.0–2.5)
ALT: 9 U/L (ref 6–29)
AST: 15 U/L (ref 10–35)
Albumin: 3.6 g/dL (ref 3.6–5.1)
Alkaline phosphatase (APISO): 88 U/L (ref 37–153)
BUN: 17 mg/dL (ref 7–25)
CO2: 29 mmol/L (ref 20–32)
Calcium: 9.1 mg/dL (ref 8.6–10.4)
Chloride: 105 mmol/L (ref 98–110)
Creat: 0.79 mg/dL (ref 0.50–1.05)
GFR, Est African American: 96 mL/min/{1.73_m2} (ref >=60)
GFR, Est Non African American: 83 mL/min/{1.73_m2} (ref >=60)
Globulin: 2.7 g/dL (calc) (ref 1.9–3.7)
Glucose, Bld: 123 mg/dL — ABNORMAL HIGH (ref 65–99)
Potassium: 4.9 mmol/L (ref 3.5–5.3)
Sodium: 139 mmol/L (ref 135–146)
Total Bilirubin: 0.5 mg/dL (ref 0.2–1.2)
Total Protein: 6.3 g/dL (ref 6.1–8.1)

## 2020-10-26 NOTE — Progress Notes (Signed)
Glucose is 123.  Rest of CMP WNL.  CBC WNL.

## 2020-11-09 NOTE — Progress Notes (Signed)
Office Visit Note  Patient: [name]             Date of Birth: Dec 28, 1960           MRN: [id number]             PCP: Patient, No Pcp Per (Inactive) Referring: No ref. provider found Visit Date: 11/23/2020 Occupation: @GUAROCC @  Subjective:  Medication management   History of Present Illness: [name] is a 60 y.o. female with a history of psoriatic arthritis and psoriasis.  She has been taking methotrexate on a regular basis.  She denies any increased joint pain or joint swelling.  She denies any psoriasis lesions.  She states that she had COVID-19 booster approximately 2 weeks ago.  She came off methotrexate a week prior to it and has been holding methotrexate for the next 2weeks.  She denies any history of iritis, plantar fasciitis or Achilles tendinitis.  Activities of Daily Living:  Patient reports morning stiffness for 10 minutes.   Patient Denies nocturnal pain.  Difficulty dressing/grooming: Denies Difficulty climbing stairs: Denies Difficulty getting out of chair: Denies Difficulty using hands for taps, buttons, cutlery, and/or writing: Denies  Review of Systems  Constitutional:  Negative for fatigue.  HENT:  Negative for mouth sores, mouth dryness and nose dryness.   Eyes:  Negative for pain, itching and dryness.  Respiratory:  Negative for shortness of breath and difficulty breathing.   Cardiovascular:  Negative for chest pain and palpitations.  Gastrointestinal:  Negative for blood in stool, constipation and diarrhea.  Endocrine: Negative for increased urination.  Genitourinary:  Negative for difficulty urinating.  Musculoskeletal:  Positive for morning stiffness. Negative for joint pain, joint pain, joint swelling, myalgias, muscle tenderness and myalgias.  Skin:  Positive for rash. Negative for color change.  Allergic/Immunologic: Negative for susceptible to infections.  Neurological:  Positive for numbness. Negative for dizziness, headaches,  memory loss and weakness.  Hematological:  Negative for bruising/bleeding tendency.  Psychiatric/Behavioral:  Negative for confusion.    PMFS History:  Patient Active Problem List   Diagnosis Date Noted   Pain in both hands 08/27/2016   High risk medication use 08/27/2016   Psoriatic arthritis (HCC) 08/27/2016   History of obesity 08/27/2016   Other psoriasis 08/27/2016   Methotrexate, long term, current use 06/22/2016    Past Medical History:  Diagnosis Date   Migraines    Osteoarthritis    Psoriasis    Psoriatic arthritis (HCC)     Family History  Problem Relation Age of Onset   Cancer Mother        Lung   Heart disease Father    Healthy Son    Healthy Son    Past Surgical History:  Procedure Laterality Date   CESAREAN SECTION  1995, 1998   Social History   Social History Narrative   Not on file   Immunization History  Administered Date(s) Administered   PFIZER(Purple Top)SARS-COV-2 Vaccination 07/09/2019, 07/30/2019, 03/11/2020, 11/11/2020     Objective: Vital Signs: BP 103/69 (BP Location: Left Arm, Patient Position: Sitting, Cuff Size: Normal)   Pulse 60   Ht 5\' 3"  (1.6 m)   Wt 198 lb 6.4 oz (90 kg)   LMP 07/15/2013   BMI 35.14 kg/m    Physical Exam Vitals and nursing note reviewed.  Constitutional:      Appearance: She is well-developed.  HENT:     Head: Normocephalic and atraumatic.  Eyes:     Conjunctiva/sclera: Conjunctivae  normal.  Cardiovascular:     Rate and Rhythm: Normal rate and regular rhythm.     Heart sounds: Normal heart sounds.  Pulmonary:     Effort: Pulmonary effort is normal.     Breath sounds: Normal breath sounds.  Abdominal:     General: Bowel sounds are normal.     Palpations: Abdomen is soft.  Musculoskeletal:     Cervical back: Normal range of motion.  Lymphadenopathy:     Cervical: No cervical adenopathy.  Skin:    General: Skin is warm and dry.     Capillary Refill: Capillary refill takes less than 2 seconds.   Neurological:     Mental Status: She is alert and oriented to person, place, and time.  Psychiatric:        Behavior: Behavior normal.     Musculoskeletal Exam: C-spine was in good range of motion.  Shoulder joints, elbow joints, wrist joints, MCPs PIPs and DIPs with good range of motion with no synovitis.  Hip joints, knee joints, ankles, MTPs and PIPs with good range of motion with no synovitis.  CDAI Exam: CDAI Score: -- Patient Global: --; Provider Global: -- Swollen: --; Tender: -- Joint Exam 11/23/2020   No joint exam has been documented for this visit   There is currently no information documented on the homunculus. Go to the Rheumatology activity and complete the homunculus joint exam.  Investigation: No additional findings.  Imaging: No results found.  Recent Labs: Lab Results  Component Value Date   WBC 9.2 10/25/2020   HGB 13.4 10/25/2020   PLT 279 10/25/2020   NA 139 10/25/2020   K 4.9 10/25/2020   CL 105 10/25/2020   CO2 29 10/25/2020   GLUCOSE 123 (H) 10/25/2020   BUN 17 10/25/2020   CREATININE 0.79 10/25/2020   BILITOT 0.5 10/25/2020   ALKPHOS 108 01/10/2017   AST 15 10/25/2020   ALT 9 10/25/2020   PROT 6.3 10/25/2020   ALBUMIN 3.9 01/10/2017   CALCIUM 9.1 10/25/2020   GFRAA 96 10/25/2020    Speciality Comments: PLQ Eye Exam 05/07/17 WNL  Procedures:  No procedures performed Allergies: Penicillins   Assessment / Plan:     Visit Diagnoses: Psoriatic arthritis (HCC)-she had no active synovitis on examination.  There was no evidence of dactylitis, Achilles tendinitis or plantar fasciitis.  She had lateral epicondylitis in the past which resolved.  Other psoriasis-she had no active psoriasis lesions.  High risk medication use - Methotrexate 4 tablets by mouth every week and folic acid 1 mg by mouth daily.  Her labs from October 25, 2020 were normal.  She has been advised to get labs every 3 months to monitor for drug toxicity.  She she was advised  to stop methotrexate in case she develops an infection and resume after infection resolves.  Information regarding other vaccines were also placed in the AVS.  Trochanteric bursitis, left hip - Resolved   Osteoporosis screening - DEXA 12/09/2019 T-score: -0.6 BMD: 0.785.  Use of calcium rich diet with vitamin D and resistive exercises were emphasized.  Postmenopausal-increased risk of heart disease with psoriatic arthritis was discussed.  Need for regular exercise and dietary modifications were emphasized and handout was placed in the AVS.  Orders: No orders of the defined types were placed in this encounter.  No orders of the defined types were placed in this encounter.   Follow-Up Instructions: Return in about 5 months (around 04/25/2021) for Psoriatic arthritis.   [name], MD  Note - This record has been created using [employer].  Chart creation errors have been sought, but may not always  have been located. Such creation errors do not reflect on  the standard of medical care.

## 2020-11-16 ENCOUNTER — Ambulatory Visit: Payer: BC Managed Care – PPO | Admitting: Rheumatology

## 2020-11-23 ENCOUNTER — Ambulatory Visit: Payer: BC Managed Care – PPO | Admitting: Rheumatology

## 2020-11-23 ENCOUNTER — Other Ambulatory Visit: Payer: Self-pay

## 2020-11-23 ENCOUNTER — Encounter: Payer: Self-pay | Admitting: Rheumatology

## 2020-11-23 VITALS — BP 103/69 | HR 60 | Ht 63.0 in | Wt 198.4 lb

## 2020-11-23 DIAGNOSIS — Z13820 Encounter for screening for osteoporosis: Secondary | ICD-10-CM

## 2020-11-23 DIAGNOSIS — L4050 Arthropathic psoriasis, unspecified: Secondary | ICD-10-CM

## 2020-11-23 DIAGNOSIS — M7062 Trochanteric bursitis, left hip: Secondary | ICD-10-CM

## 2020-11-23 DIAGNOSIS — L408 Other psoriasis: Secondary | ICD-10-CM | POA: Diagnosis not present

## 2020-11-23 DIAGNOSIS — Z780 Asymptomatic menopausal state: Secondary | ICD-10-CM

## 2020-11-23 DIAGNOSIS — Z79899 Other long term (current) drug therapy: Secondary | ICD-10-CM | POA: Diagnosis not present

## 2020-11-23 NOTE — Patient Instructions (Signed)
Standing Labs We placed an order today for your standing lab work.   Please have your standing labs drawn in September and every 3 months  If possible, please have your labs drawn 2 weeks prior to your appointment so that the provider can discuss your results at your appointment.  Please note that you may see your imaging and lab results in MyChart before we have reviewed them. We may be awaiting multiple results to interpret others before contacting you. Please allow our office up to 72 hours to thoroughly review all of the results before contacting the office for clarification of your results.  We have open lab daily: Monday through Thursday from 1:30-4:30 PM and Friday from 1:30-4:00 PM at the office of Dr. Shi Grose, [facility].   Please be advised, all patients with office appointments requiring lab work will take precedent over walk-in lab work.  If possible, please come for your lab work on Monday and Friday afternoons, as you may experience shorter wait times. The office is located at [address], [address], [place], [place] [address] No appointment is necessary.   Labs are drawn by [facility]. Please bring your co-pay at the time of your lab draw.  You may receive a bill from [facility] for your lab work.  If you wish to have your labs drawn at another location, please call the office 24 hours in advance to send orders.  If you have any questions regarding directions or hours of operation,  please call [phone].   As a reminder, please drink plenty of water prior to coming for your lab work. Thanks!   Vaccines You are taking a medication(s) that can suppress your immune system.  The following immunizations are recommended: Flu annually Covid-19  Td/Tdap (tetanus, diphtheria, pertussis) every 10 years Pneumonia (Prevnar 15 then Pneumovax 23 at least 1 year apart.  Alternatively, can take Prevnar 20 without needing additional dose) Shingrix (after age 50): 2  doses from 4 weeks to 6 months apart  Please check with your PCP to make sure you are up to date.   If you test POSITIVE for COVID19 and have MILD to MODERATE symptoms: First, call your PCP if you would like to receive COVID19 treatment AND Hold your medications during the infection and for at least 1 week after your symptoms have resolved: Injectable medication (Benlysta, Cimzia, Cosentyx, Enbrel, Humira, Orencia, Remicade, Simponi, Stelara, Taltz, Tremfya) Methotrexate Leflunomide (Arava) Mycophenolate (Cellcept) Xeljanz, Olumiant, or Rinvoq If you take Actemra or Kevzara, you DO NOT need to hold these for COVID19 infection.  If you test POSITIVE for COVID19 and have NO symptoms: First, call your PCP if you would like to receive COVID19 treatment AND Hold your medications for at least 10 days after the day that you tested positive Injectable medication (Benlysta, Cimzia, Cosentyx, Enbrel, Humira, Orencia, Remicade, Simponi, Stelara, Taltz, Tremfya) Methotrexate Leflunomide (Arava) Mycophenolate (Cellcept) Xeljanz, Olumiant, or Rinvoq If you take Actemra or Kevzara, you DO NOT need to hold these for COVID19 infection.  If you have signs or symptoms of an infection or start antibiotics: First, call your PCP for workup of your infection. Hold your medication through the infection, until you complete your antibiotics, and until symptoms resolve if you take the following: Injectable medication (Actemra, Benlysta, Cimzia, Cosentyx, Enbrel, Humira, Kevzara, Orencia, Remicade, Simponi, Stelara, Taltz, Tremfya) Methotrexate Leflunomide (Arava) Mycophenolate (Cellcept) Xeljanz, Olumiant, or Rinvoq  Heart Disease Prevention   Your inflammatory disease increases your risk of heart disease which includes   heart attack, stroke, atrial fibrillation (irregular heartbeats), high blood pressure, heart failure and atherosclerosis (plaque in the arteries).  It is important to reduce your risk by:    Keep blood pressure, cholesterol, and blood sugar at healthy levels   Smoking Cessation   Maintain a healthy weight  BMI 20-25   Eat a healthy diet  Plenty of fresh fruit, vegetables, and whole grains  Limit saturated fats, foods high in sodium, and added sugars  DASH and Mediterranean diet   Increase physical activity  Recommend moderate physically activity for 150 minutes per week/ 30 minutes a day for five days a week These can be broken up into three separate ten-minute sessions during the day.   Reduce Stress  Meditation, slow breathing exercises, yoga, coloring books  Dental visits twice a year   

## 2021-01-24 ENCOUNTER — Other Ambulatory Visit: Payer: Self-pay | Admitting: Physician Assistant

## 2021-01-24 NOTE — Telephone Encounter (Signed)
Next Visit: 04/26/2021  Last Visit: 11/23/2020  Last Fill: 10/16/2020  DX: Psoriatic arthritis   Current Dose per office note 11/23/2020: Methotrexate 4 tablets by mouth every week   Labs: 10/25/2020 Glucose is 123. Rest of CMP WNL.  CBC WNL.    Okay to refill MTX?

## 2021-04-13 NOTE — Progress Notes (Deleted)
Office Visit Note  Patient: [name]             Date of Birth: 19-Feb-1961           MRN: [id number]             PCP: Patient, No Pcp Per (Inactive) Referring: No ref. provider found Visit Date: 04/26/2021 Occupation: @GUAROCC @  Subjective:  No chief complaint on file.   History of Present Illness: [name] is a 60 y.o. female ***   Activities of Daily Living:  Patient reports morning stiffness for *** {minute/hour:19697}.   Patient {ACTIONS;DENIES/REPORTS:21021675::"Denies"} nocturnal pain.  Difficulty dressing/grooming: {ACTIONS;DENIES/REPORTS:21021675::"Denies"} Difficulty climbing stairs: {ACTIONS;DENIES/REPORTS:21021675::"Denies"} Difficulty getting out of chair: {ACTIONS;DENIES/REPORTS:21021675::"Denies"} Difficulty using hands for taps, buttons, cutlery, and/or writing: {ACTIONS;DENIES/REPORTS:21021675::"Denies"}  No Rheumatology ROS completed.   PMFS History:  Patient Active Problem List   Diagnosis Date Noted   Pain in both hands 08/27/2016   High risk medication use 08/27/2016   Psoriatic arthritis (HCC) 08/27/2016   History of obesity 08/27/2016   Other psoriasis 08/27/2016   Methotrexate, long term, current use 06/22/2016    Past Medical History:  Diagnosis Date   Migraines    Osteoarthritis    Psoriasis    Psoriatic arthritis (HCC)     Family History  Problem Relation Age of Onset   Cancer Mother        Lung   Heart disease Father    Healthy Son    Healthy Son    Past Surgical History:  Procedure Laterality Date   CESAREAN SECTION  1995, 1998   Social History   Social History Narrative   Not on file   Immunization History  Administered Date(s) Administered   PFIZER(Purple Top)SARS-COV-2 Vaccination 07/09/2019, 07/30/2019, 03/11/2020, 11/11/2020     Objective: Vital Signs: LMP 07/15/2013    Physical Exam   Musculoskeletal Exam: ***  CDAI Exam: CDAI Score: -- Patient Global: --; Provider Global: -- Swollen:  --; Tender: -- Joint Exam 04/26/2021   No joint exam has been documented for this visit   There is currently no information documented on the homunculus. Go to the Rheumatology activity and complete the homunculus joint exam.  Investigation: No additional findings.  Imaging: No results found.  Recent Labs: Lab Results  Component Value Date   WBC 9.2 10/25/2020   HGB 13.4 10/25/2020   PLT 279 10/25/2020   NA 139 10/25/2020   K 4.9 10/25/2020   CL 105 10/25/2020   CO2 29 10/25/2020   GLUCOSE 123 (H) 10/25/2020   BUN 17 10/25/2020   CREATININE 0.79 10/25/2020   BILITOT 0.5 10/25/2020   ALKPHOS 108 01/10/2017   AST 15 10/25/2020   ALT 9 10/25/2020   PROT 6.3 10/25/2020   ALBUMIN 3.9 01/10/2017   CALCIUM 9.1 10/25/2020   GFRAA 96 10/25/2020    Speciality Comments: PLQ Eye Exam 05/07/17 WNL  Procedures:  No procedures performed Allergies: Penicillins   Assessment / Plan:     Visit Diagnoses: No diagnosis found.  Orders: No orders of the defined types were placed in this encounter.  No orders of the defined types were placed in this encounter.   Face-to-face time spent with patient was *** minutes. Greater than 50% of time was spent in counseling and coordination of care.  Follow-Up Instructions: No follow-ups on file.   05/09/17, CMA  Note - This record has been created using Ellen Henri.  Chart creation errors have been sought, but may not always  have been located. Such creation errors do not reflect on  the standard of medical care.

## 2021-04-25 ENCOUNTER — Other Ambulatory Visit: Payer: Self-pay | Admitting: Physician Assistant

## 2021-04-26 ENCOUNTER — Ambulatory Visit: Payer: BC Managed Care – PPO | Admitting: Physician Assistant

## 2021-04-26 DIAGNOSIS — Z13820 Encounter for screening for osteoporosis: Secondary | ICD-10-CM

## 2021-04-26 DIAGNOSIS — M7062 Trochanteric bursitis, left hip: Secondary | ICD-10-CM

## 2021-04-26 DIAGNOSIS — L408 Other psoriasis: Secondary | ICD-10-CM

## 2021-04-26 DIAGNOSIS — Z79899 Other long term (current) drug therapy: Secondary | ICD-10-CM

## 2021-04-26 DIAGNOSIS — L4050 Arthropathic psoriasis, unspecified: Secondary | ICD-10-CM

## 2021-04-26 DIAGNOSIS — Z780 Asymptomatic menopausal state: Secondary | ICD-10-CM

## 2021-04-30 NOTE — Progress Notes (Signed)
Office Visit Note  Patient: [name]             Date of Birth: 15-Feb-1961           MRN: [id number]             PCP: Patient, No Pcp Per (Inactive) Referring: No ref. provider found Visit Date: 05/01/2021 Occupation: @GUAROCC @  Subjective:  Medication monitoring   History of Present Illness: [name] is a 60 y.o. female with history of psoriatic arthritis. She is currently taking methotrexate 4 tablets by mouth once weekly and folic acid 1 mg daily.  She is tolerating methotrexate and folic acid without any side effects and has not missed any doses recently.  She denies any signs or symptoms of a psoriatic arthritis flare.  She has not had any joint pain or joint swelling.  She denies any Achilles tendinitis or plantar fasciitis.  She denies any SI joint discomfort.  She denies any active psoriasis at this time. She has not had any recent infections.  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  She has not received the Shingrix vaccine or the pneumonia vaccine either.    Activities of Daily Living:  Patient reports morning stiffness for 5 minutes.   Patient Denies nocturnal pain.  Difficulty dressing/grooming: Denies Difficulty climbing stairs: Denies Difficulty getting out of chair: Denies Difficulty using hands for taps, buttons, cutlery, and/or writing: Denies  Review of Systems  Constitutional:  Negative for fatigue.  HENT:  Negative for mouth sores, mouth dryness and nose dryness.   Eyes:  Negative for pain, itching and dryness.  Respiratory:  Negative for shortness of breath and difficulty breathing.   Cardiovascular:  Negative for chest pain and palpitations.  Gastrointestinal:  Negative for blood in stool, constipation and diarrhea.  Endocrine: Negative for increased urination.  Genitourinary:  Negative for difficulty urinating.  Musculoskeletal:  Positive for morning stiffness.  Negative for joint pain, joint pain, joint swelling, myalgias, muscle tenderness and myalgias.  Skin:  Negative for color change, rash and redness.  Allergic/Immunologic: Negative for susceptible to infections.  Neurological:  Negative for dizziness, numbness, headaches, memory loss and weakness.  Hematological:  Negative for bruising/bleeding tendency.  Psychiatric/Behavioral:  Negative for confusion.    PMFS History:  Patient Active Problem List   Diagnosis Date Noted   Pain in both hands 08/27/2016   High risk medication use 08/27/2016   Psoriatic arthritis ([place]) 08/27/2016   History of obesity 08/27/2016   Other psoriasis 08/27/2016   Methotrexate, long term, current use 06/22/2016    Past Medical History:  Diagnosis Date   Migraines    Osteoarthritis    Psoriasis    Psoriatic arthritis ([place])     Family History  Problem Relation Age of Onset   Cancer Mother        Lung   Heart disease Father    Healthy Son    Healthy Son    Past Surgical History:  Procedure Laterality Date   CESAREAN SECTION  1995, 1998   Social History   Social History Narrative   Not on file   Immunization History  Administered Date(s) Administered   PFIZER(Purple Top)SARS-COV-2 Vaccination 07/09/2019, 07/30/2019, 03/11/2020, 11/11/2020     Objective: Vital Signs: BP 126/75 (BP Location: Left Arm, Patient Position: Sitting, Cuff Size: Normal)    Pulse 60    Ht 5\' 3"  (1.6 m)    Wt  198 lb (89.8 kg)    LMP 07/15/2013    BMI 35.07 kg/m    Physical Exam Vitals and nursing note reviewed.  Constitutional:      Appearance: She is well-developed.  HENT:     Head: Normocephalic and atraumatic.  Eyes:     Conjunctiva/sclera: Conjunctivae normal.  Pulmonary:     Effort: Pulmonary effort is normal.  Abdominal:     Palpations: Abdomen is soft.  Musculoskeletal:     Cervical back: Normal range of motion.  Skin:    General: Skin is warm and dry.     Capillary Refill: Capillary refill takes less  than 2 seconds.  Neurological:     Mental Status: She is alert and oriented to person, place, and time.  Psychiatric:        Behavior: Behavior normal.     Musculoskeletal Exam: C-spine, thoracic spine, lumbar spine have good range of motion with no discomfort.  No midline spinal tenderness or SI joint tenderness.  Shoulder joints, elbow joints, wrist joints, MCPs, PIPs, DIPs have good range of motion with no synovitis.  PIP and DIP prominence consistent with osteoarthritis of both hands noted.  Complete fist formation bilaterally.  Hip joints have good range of motion with no groin pain.  Some tenderness over the right trochanteric bursa.  Knee joints have good range of motion with no warmth or effusion.  Ankle joints have good range of motion with no tenderness or joint swelling.  No evidence of Achilles tendinitis.  CDAI Exam: CDAI Score: -- Patient Global: --; Provider Global: -- Swollen: --; Tender: -- Joint Exam 05/01/2021   No joint exam has been documented for this visit   There is currently no information documented on the homunculus. Go to the Rheumatology activity and complete the homunculus joint exam.  Investigation: No additional findings.  Imaging: No results found.  Recent Labs: Lab Results  Component Value Date   WBC 9.2 10/25/2020   HGB 13.4 10/25/2020   PLT 279 10/25/2020   NA 139 10/25/2020   K 4.9 10/25/2020   CL 105 10/25/2020   CO2 29 10/25/2020   GLUCOSE 123 (H) 10/25/2020   BUN 17 10/25/2020   CREATININE 0.79 10/25/2020   BILITOT 0.5 10/25/2020   ALKPHOS 108 01/10/2017   AST 15 10/25/2020   ALT 9 10/25/2020   PROT 6.3 10/25/2020   ALBUMIN 3.9 01/10/2017   CALCIUM 9.1 10/25/2020   GFRAA 96 10/25/2020    Speciality Comments: PLQ Eye Exam 05/07/17 WNL  Procedures:  No procedures performed Allergies: Penicillins   Assessment / Plan:     Visit Diagnoses: Psoriatic arthritis ([place]): She has no synovitis or dactylitis on examination.  She has  not had any signs or symptoms of a psoriatic arthritis flare.  She is clinically doing well on methotrexate 4 tablets by mouth once weekly and folic acid 1 mg daily.  She continues to tolerate methotrexate without any side effects and has not missed any doses recently.  She has no evidence of Achilles tendinitis or plantar fasciitis.  She has no SI joint tenderness to palpation.  She has not been experiencing any nocturnal pain and her morning stiffness has only been lasting about 5 minutes daily.  She has not had any difficulty with ADLs.  She was strongly encouraged to start exercising on a daily basis.  She will remain on the current treatment regimen.  A refill of methotrexate was sent to the pharmacy today.  She was advised to  notify us if she develops increased joint pain or joint swelling.  She will follow-up in the office in 5 months.  Association of heart disease with psoriatic arthritis was discussed. Need to monitor blood pressure, cholesterol, and to exercise 30-60 minutes on daily basis was discussed.    Other psoriasis: She has no active psoriasis at this time.   High risk medication use - Methotrexate 4 tablets by mouth every week and folic acid 1 mg by mouth daily.  CBC and CMP updated on 10/25/20.  She is overdue to update lab work.  Orders for CBC and CMP released.  Her next lab work will be due in March and every 3 months.  Standing orders for CBC and CMP are in place.  - Plan: CBC with Differential/Platelet, COMPLETE METABOLIC PANEL WITH GFR She has not had any recent infections.  Discussed the importance of holding methotrexate if she develops signs or symptoms of an infection and to resume once the infection has completely cleared.  She was strongly encouraged to receive the annual influenza vaccination as well as talk with her PCP about proceeding with the Shingrix vaccine and pneumonia vaccine.  Trochanteric bursitis, right hip -She has mild tenderness over the right trochanteric  bursa.  Her symptoms have been tolerable. She has not had any nocturnal pain or difficulty walking.   Osteoporosis screening - DEXA 12/09/2019 T-score: -0.6 BMD: 0.785.   Postmenopausal  Orders: Orders Placed This Encounter  Procedures   CBC with Differential/Platelet   COMPLETE METABOLIC PANEL WITH GFR   Meds ordered this encounter  Medications   methotrexate (RHEUMATREX) 2.5 MG tablet    Sig: Take 4 tablets (10 mg total) by mouth once a week. Caution:Chemotherapy. Protect from light.    Dispense:  48 tablet    Refill:  0      Follow-Up Instructions: Return in about 5 months (around 09/29/2021) for Psoriatic arthritis.   Gearldine Bienenstock, PA-C  Note - This record has been created using Dragon software.  Chart creation errors have been sought, but may not always  have been located. Such creation errors do not reflect on  the standard of medical care.

## 2021-05-01 ENCOUNTER — Ambulatory Visit: Payer: BC Managed Care – PPO | Admitting: Physician Assistant

## 2021-05-01 ENCOUNTER — Encounter: Payer: Self-pay | Admitting: Physician Assistant

## 2021-05-01 ENCOUNTER — Other Ambulatory Visit: Payer: Self-pay

## 2021-05-01 VITALS — BP 126/75 | HR 60 | Ht 63.0 in | Wt 198.0 lb

## 2021-05-01 DIAGNOSIS — M7062 Trochanteric bursitis, left hip: Secondary | ICD-10-CM | POA: Diagnosis not present

## 2021-05-01 DIAGNOSIS — L4050 Arthropathic psoriasis, unspecified: Secondary | ICD-10-CM | POA: Diagnosis not present

## 2021-05-01 DIAGNOSIS — L408 Other psoriasis: Secondary | ICD-10-CM

## 2021-05-01 DIAGNOSIS — Z780 Asymptomatic menopausal state: Secondary | ICD-10-CM

## 2021-05-01 DIAGNOSIS — Z13820 Encounter for screening for osteoporosis: Secondary | ICD-10-CM

## 2021-05-01 DIAGNOSIS — Z79899 Other long term (current) drug therapy: Secondary | ICD-10-CM | POA: Diagnosis not present

## 2021-05-01 LAB — COMPLETE METABOLIC PANEL WITH GFR
AG Ratio: 1.4 (calc) (ref 1.0–2.5)
ALT: 12 U/L (ref 6–29)
AST: 15 U/L (ref 10–35)
Albumin: 3.7 g/dL (ref 3.6–5.1)
Alkaline phosphatase (APISO): 86 U/L (ref 37–153)
BUN: 12 mg/dL (ref 7–25)
CO2: 29 mmol/L (ref 20–32)
Calcium: 8.8 mg/dL (ref 8.6–10.4)
Chloride: 105 mmol/L (ref 98–110)
Creat: 0.76 mg/dL (ref 0.50–1.03)
Globulin: 2.6 g/dL (calc) (ref 1.9–3.7)
Glucose, Bld: 97 mg/dL (ref 65–99)
Potassium: 4.8 mmol/L (ref 3.5–5.3)
Sodium: 141 mmol/L (ref 135–146)
Total Bilirubin: 0.5 mg/dL (ref 0.2–1.2)
Total Protein: 6.3 g/dL (ref 6.1–8.1)
eGFR: 91 mL/min/{1.73_m2} (ref >=60)

## 2021-05-01 LAB — CBC WITH DIFFERENTIAL/PLATELET
Absolute Monocytes: 640 cells/uL (ref 200–950)
Basophils Absolute: 70 cells/uL (ref 0–200)
Basophils Relative: 0.9 %
Eosinophils Absolute: 320 cells/uL (ref 15–500)
Eosinophils Relative: 4.1 %
HCT: 40.7 % (ref 35.0–45.0)
Hemoglobin: 13.5 g/dL (ref 11.7–15.5)
Lymphs Abs: 2067 cells/uL (ref 850–3900)
MCH: 32.6 pg (ref 27.0–33.0)
MCHC: 33.2 g/dL (ref 32.0–36.0)
MCV: 98.3 fL (ref 80.0–100.0)
MPV: 10.2 fL (ref 7.5–12.5)
Monocytes Relative: 8.2 %
Neutro Abs: 4703 cells/uL (ref 1500–7800)
Neutrophils Relative %: 60.3 %
Platelets: 300 10*3/uL (ref 140–400)
RBC: 4.14 10*6/uL (ref 3.80–5.10)
RDW: 13.7 % (ref 11.0–15.0)
Total Lymphocyte: 26.5 %
WBC: 7.8 10*3/uL (ref 3.8–10.8)

## 2021-05-01 MED ORDER — METHOTREXATE 2.5 MG PO TABS: 10.0000 mg | ORAL_TABLET | ORAL | 0 refills | Status: DC

## 2021-05-01 NOTE — Patient Instructions (Addendum)
Standing Labs °We placed an order today for your standing lab work.  ° °Please have your standing labs drawn in March and every 3 months ° °If possible, please have your labs drawn 2 weeks prior to your appointment so that the provider can discuss your results at your appointment. ° °Please note that you may see your imaging and lab results in MyChart before we have reviewed them. °We may be awaiting multiple results to interpret others before contacting you. °Please allow our office up to 72 hours to thoroughly review all of the results before contacting the office for clarification of your results. ° °We have open lab daily: °Monday through Thursday from 1:30-4:30 PM and Friday from 1:30-4:00 PM °at the office of Dr. Shaili Deveshwar, [facility].   °Please be advised, all patients with office appointments requiring lab work will take precedent over walk-in lab work.  °If possible, please come for your lab work on Monday and Friday afternoons, as you may experience shorter wait times. °The office is located at [address], [address], [place], [place] [address] °No appointment is necessary.   °Labs are drawn by [facility]. Please bring your co-pay at the time of your lab draw.  You may receive a bill from [facility] for your lab work. ° °If you wish to have your labs drawn at another location, please call the office 24 hours in advance to send orders. ° °If you have any questions regarding directions or hours of operation,  °please call [phone].   °As a reminder, please drink plenty of water prior to coming for your lab work. Thanks!  ° °Vaccines °You are taking a medication(s) that can suppress your immune system.  The following immunizations are recommended: °Flu annually °Covid-19  °Td/Tdap (tetanus, diphtheria, pertussis) every 10 years °Pneumonia (Prevnar 15 then Pneumovax 23 at least 1 year apart.  Alternatively, can take Prevnar 20 without needing additional dose) °Shingrix: 2 doses from 4 weeks  to 6 months apart ° °Please check with your PCP to make sure you are up to date.  ° °If you have signs or symptoms of an infection or start antibiotics: °First, call your PCP for workup of your infection. °Hold your medication through the infection, until you complete your antibiotics, and until symptoms resolve if you take the following: °Injectable medication (Actemra, Benlysta, Cimzia, Cosentyx, Enbrel, Humira, Kevzara, Orencia, Remicade, Simponi, Stelara, Taltz, Tremfya) °Methotrexate °Leflunomide (Arava) °Mycophenolate (Cellcept) °Xeljanz, Olumiant, or Rinvoq  °

## 2021-05-02 NOTE — Progress Notes (Signed)
CBC and CMP WNL

## 2021-10-23 ENCOUNTER — Other Ambulatory Visit: Payer: Self-pay | Admitting: *Deleted

## 2021-10-23 DIAGNOSIS — Z79899 Other long term (current) drug therapy: Secondary | ICD-10-CM

## 2021-10-24 ENCOUNTER — Other Ambulatory Visit: Payer: Self-pay | Admitting: Physician Assistant

## 2021-10-24 LAB — COMPLETE METABOLIC PANEL WITH GFR
AG Ratio: 1.2 (calc) (ref 1.0–2.5)
ALT: 11 U/L (ref 6–29)
AST: 16 U/L (ref 10–35)
Albumin: 3.6 g/dL (ref 3.6–5.1)
Alkaline phosphatase (APISO): 86 U/L (ref 37–153)
BUN: 16 mg/dL (ref 7–25)
CO2: 28 mmol/L (ref 20–32)
Calcium: 9 mg/dL (ref 8.6–10.4)
Chloride: 106 mmol/L (ref 98–110)
Creat: 0.85 mg/dL (ref 0.50–1.03)
Globulin: 2.9 g/dL (calc) (ref 1.9–3.7)
Glucose, Bld: 88 mg/dL (ref 65–99)
Potassium: 5.4 mmol/L — ABNORMAL HIGH (ref 3.5–5.3)
Sodium: 141 mmol/L (ref 135–146)
Total Bilirubin: 0.6 mg/dL (ref 0.2–1.2)
Total Protein: 6.5 g/dL (ref 6.1–8.1)
eGFR: 79 mL/min/{1.73_m2} (ref >=60)

## 2021-10-24 LAB — CBC WITH DIFFERENTIAL/PLATELET
Absolute Monocytes: 739 cells/uL (ref 200–950)
Basophils Absolute: 71 cells/uL (ref 0–200)
Basophils Relative: 0.8 %
Eosinophils Absolute: 294 cells/uL (ref 15–500)
Eosinophils Relative: 3.3 %
HCT: 40.1 % (ref 35.0–45.0)
Hemoglobin: 13.4 g/dL (ref 11.7–15.5)
Lymphs Abs: 2261 cells/uL (ref 850–3900)
MCH: 32.7 pg (ref 27.0–33.0)
MCHC: 33.4 g/dL (ref 32.0–36.0)
MCV: 97.8 fL (ref 80.0–100.0)
MPV: 9.9 fL (ref 7.5–12.5)
Monocytes Relative: 8.3 %
Neutro Abs: 5536 cells/uL (ref 1500–7800)
Neutrophils Relative %: 62.2 %
Platelets: 274 10*3/uL (ref 140–400)
RBC: 4.1 10*6/uL (ref 3.80–5.10)
RDW: 13.3 % (ref 11.0–15.0)
Total Lymphocyte: 25.4 %
WBC: 8.9 10*3/uL (ref 3.8–10.8)

## 2021-10-24 NOTE — Progress Notes (Signed)
CBC and CMP normal.  Potassium is mildly elevated probably a hemolyzed sample.

## 2021-10-24 NOTE — Telephone Encounter (Signed)
Next Visit: 11/06/2021  Last Visit: 05/01/2021  Last Fill: 05/01/2021  DX: Psoriatic arthritis   Current Dose per office note 05/01/2021: Methotrexate 4 tablets by mouth every week   Labs: 10/23/2021 Potassium 5.4 all other labs WNL  Okay to refill MTX?

## 2021-10-26 NOTE — Progress Notes (Unsigned)
Office Visit Note  Patient: [name]             Date of Birth: 1960/10/04           MRN: [id number]             PCP: Patient, No Pcp Per Referring: No ref. provider found Visit Date: 11/06/2021 Occupation: @GUAROCC @  Subjective:  No chief complaint on file.   History of Present Illness: [name] is a 61 y.o. female ***   Activities of Daily Living:  Patient reports morning stiffness for *** {minute/hour:19697}.   Patient {ACTIONS;DENIES/REPORTS:21021675::"Denies"} nocturnal pain.  Difficulty dressing/grooming: {ACTIONS;DENIES/REPORTS:21021675::"Denies"} Difficulty climbing stairs: {ACTIONS;DENIES/REPORTS:21021675::"Denies"} Difficulty getting out of chair: {ACTIONS;DENIES/REPORTS:21021675::"Denies"} Difficulty using hands for taps, buttons, cutlery, and/or writing: {ACTIONS;DENIES/REPORTS:21021675::"Denies"}  No Rheumatology ROS completed.   PMFS History:  Patient Active Problem List   Diagnosis Date Noted  . Pain in both hands 08/27/2016  . High risk medication use 08/27/2016  . Psoriatic arthritis (HCC) 08/27/2016  . History of obesity 08/27/2016  . Other psoriasis 08/27/2016  . Methotrexate, long term, current use 06/22/2016    Past Medical History:  Diagnosis Date  . Migraines   . Osteoarthritis   . Psoriasis   . Psoriatic arthritis (HCC)     Family History  Problem Relation Age of Onset  . Cancer Mother        Lung  . Heart disease Father   . Healthy Son   . Healthy Son    Past Surgical History:  Procedure Laterality Date  . CESAREAN SECTION  1995, 1998   Social History   Social History Narrative  . Not on file   Immunization History  Administered Date(s) Administered  . PFIZER(Purple Top)SARS-COV-2 Vaccination 07/09/2019, 07/30/2019, 03/11/2020, 11/11/2020     Objective: Vital Signs: LMP 07/15/2013    Physical Exam   Musculoskeletal Exam: ***  CDAI Exam: CDAI Score: -- Patient Global: --; Provider Global:  -- Swollen: --; Tender: -- Joint Exam 11/06/2021   No joint exam has been documented for this visit   There is currently no information documented on the homunculus. Go to the Rheumatology activity and complete the homunculus joint exam.  Investigation: No additional findings.  Imaging: No results found.  Recent Labs: Lab Results  Component Value Date   WBC 8.9 10/23/2021   HGB 13.4 10/23/2021   PLT 274 10/23/2021   NA 141 10/23/2021   K 5.4 (H) 10/23/2021   CL 106 10/23/2021   CO2 28 10/23/2021   GLUCOSE 88 10/23/2021   BUN 16 10/23/2021   CREATININE 0.85 10/23/2021   BILITOT 0.6 10/23/2021   ALKPHOS 108 01/10/2017   AST 16 10/23/2021   ALT 11 10/23/2021   PROT 6.5 10/23/2021   ALBUMIN 3.9 01/10/2017   CALCIUM 9.0 10/23/2021   GFRAA 96 10/25/2020    Speciality Comments: PLQ Eye Exam 05/07/17 WNL  Procedures:  No procedures performed Allergies: Penicillins   Assessment / Plan:     Visit Diagnoses: Psoriatic arthritis (HCC)  Other psoriasis  High risk medication use  Trochanteric bursitis, left hip  Left hand paresthesia  Orders: No orders of the defined types were placed in this encounter.  No orders of the defined types were placed in this encounter.   Face-to-face time spent with patient was *** minutes. Greater than 50% of time was spent in counseling and coordination of care.  Follow-Up Instructions: No follow-ups on file.   05/09/17, PA-C  Note - This record has  been created using [employer].  Chart creation errors have been sought, but may not always  have been located. Such creation errors do not reflect on  the standard of medical care.

## 2021-11-03 ENCOUNTER — Other Ambulatory Visit: Payer: Self-pay | Admitting: Rheumatology

## 2021-11-06 ENCOUNTER — Ambulatory Visit: Payer: BC Managed Care – PPO | Admitting: Rheumatology

## 2021-11-06 ENCOUNTER — Encounter: Payer: Self-pay | Admitting: Rheumatology

## 2021-11-06 VITALS — BP 116/74 | HR 65 | Ht 63.0 in | Wt 195.0 lb

## 2021-11-06 DIAGNOSIS — Z79899 Other long term (current) drug therapy: Secondary | ICD-10-CM

## 2021-11-06 DIAGNOSIS — L4050 Arthropathic psoriasis, unspecified: Secondary | ICD-10-CM | POA: Diagnosis not present

## 2021-11-06 DIAGNOSIS — L408 Other psoriasis: Secondary | ICD-10-CM | POA: Diagnosis not present

## 2021-11-06 DIAGNOSIS — M7062 Trochanteric bursitis, left hip: Secondary | ICD-10-CM | POA: Diagnosis not present

## 2021-11-06 DIAGNOSIS — Z13820 Encounter for screening for osteoporosis: Secondary | ICD-10-CM

## 2021-11-06 DIAGNOSIS — R202 Paresthesia of skin: Secondary | ICD-10-CM

## 2021-11-23 ENCOUNTER — Other Ambulatory Visit: Payer: Self-pay | Admitting: Nurse Practitioner

## 2021-11-23 DIAGNOSIS — Z1231 Encounter for screening mammogram for malignant neoplasm of breast: Secondary | ICD-10-CM

## 2021-11-27 ENCOUNTER — Ambulatory Visit
Admission: RE | Admit: 2021-11-27 | Discharge: 2021-11-27 | Disposition: A | Discharge status: Home / Self Care | Payer: BC Managed Care – PPO | Source: Ambulatory Visit | Attending: Nurse Practitioner | Admitting: Nurse Practitioner

## 2021-11-27 DIAGNOSIS — Z1231 Encounter for screening mammogram for malignant neoplasm of breast: Secondary | ICD-10-CM

## 2022-01-21 ENCOUNTER — Other Ambulatory Visit: Payer: Self-pay | Admitting: Physician Assistant

## 2022-01-21 NOTE — Telephone Encounter (Signed)
Next Visit: 04/08/2022  Last Visit: 11/06/2021  Last Fill: 10/24/2021  DX: Psoriatic arthritis   Current Dose per office note 11/06/2021: Methotrexate 4 tablets by mouth every week   Labs: 10/23/2021 CBC and CMP normal.  Potassium is mildly elevated probably a hemolyzed sample.  Left message to advise patient she is due to update labs.   Okay to refill MTX?

## 2022-02-04 ENCOUNTER — Other Ambulatory Visit (HOSPITAL_COMMUNITY)
Admission: RE | Admit: 2022-02-04 | Discharge: 2022-02-04 | Disposition: A | Discharge status: Home / Self Care | Payer: BC Managed Care – PPO | Source: Ambulatory Visit | Attending: Nurse Practitioner | Admitting: Nurse Practitioner

## 2022-02-04 DIAGNOSIS — Z124 Encounter for screening for malignant neoplasm of cervix: Secondary | ICD-10-CM | POA: Diagnosis not present

## 2022-02-11 LAB — CYTOLOGY - PAP
Comment: NEGATIVE
Diagnosis: UNDETERMINED — AB
High risk HPV: NEGATIVE

## 2022-02-18 ENCOUNTER — Other Ambulatory Visit: Payer: Self-pay | Admitting: Physician Assistant

## 2022-02-27 ENCOUNTER — Other Ambulatory Visit: Payer: Self-pay | Admitting: *Deleted

## 2022-02-27 DIAGNOSIS — Z79899 Other long term (current) drug therapy: Secondary | ICD-10-CM

## 2022-02-28 ENCOUNTER — Other Ambulatory Visit: Payer: Self-pay | Admitting: Physician Assistant

## 2022-02-28 LAB — CBC WITH DIFFERENTIAL/PLATELET
Absolute Monocytes: 704 cells/uL (ref 200–950)
Basophils Absolute: 70 cells/uL (ref 0–200)
Basophils Relative: 0.8 %
Eosinophils Absolute: 211 cells/uL (ref 15–500)
Eosinophils Relative: 2.4 %
HCT: 39 % (ref 35.0–45.0)
Hemoglobin: 13.3 g/dL (ref 11.7–15.5)
Lymphs Abs: 2737 cells/uL (ref 850–3900)
MCH: 33 pg (ref 27.0–33.0)
MCHC: 34.1 g/dL (ref 32.0–36.0)
MCV: 96.8 fL (ref 80.0–100.0)
MPV: 10 fL (ref 7.5–12.5)
Monocytes Relative: 8 %
Neutro Abs: 5078 cells/uL (ref 1500–7800)
Neutrophils Relative %: 57.7 %
Platelets: 317 10*3/uL (ref 140–400)
RBC: 4.03 10*6/uL (ref 3.80–5.10)
RDW: 14.2 % (ref 11.0–15.0)
Total Lymphocyte: 31.1 %
WBC: 8.8 10*3/uL (ref 3.8–10.8)

## 2022-02-28 LAB — COMPLETE METABOLIC PANEL WITH GFR
AG Ratio: 1.4 (calc) (ref 1.0–2.5)
ALT: 12 U/L (ref 6–29)
AST: 17 U/L (ref 10–35)
Albumin: 3.9 g/dL (ref 3.6–5.1)
Alkaline phosphatase (APISO): 98 U/L (ref 37–153)
BUN: 18 mg/dL (ref 7–25)
CO2: 29 mmol/L (ref 20–32)
Calcium: 9 mg/dL (ref 8.6–10.4)
Chloride: 103 mmol/L (ref 98–110)
Creat: 0.87 mg/dL (ref 0.50–1.03)
Globulin: 2.7 g/dL (calc) (ref 1.9–3.7)
Glucose, Bld: 99 mg/dL (ref 65–99)
Potassium: 5 mmol/L (ref 3.5–5.3)
Sodium: 139 mmol/L (ref 135–146)
Total Bilirubin: 0.5 mg/dL (ref 0.2–1.2)
Total Protein: 6.6 g/dL (ref 6.1–8.1)
eGFR: 77 mL/min/{1.73_m2} (ref >=60)

## 2022-02-28 NOTE — Progress Notes (Signed)
CBC and CMP are normal.

## 2022-02-28 NOTE — Telephone Encounter (Signed)
Next Visit: 04/08/2022  Last Visit: 11/06/2021  Last Fill: 01/21/2022 (30 day supply)  DX: Psoriatic arthritis   Current Dose per office note 11/06/2021: Methotrexate 4 tablets by mouth every week   Labs: 02/27/2022 CBC and CMP are normal.  Okay to refill MTX?

## 2022-03-25 NOTE — Progress Notes (Deleted)
Office Visit Note  Patient: [name]             Date of Birth: 06-30-1960           MRN: [id number]             PCP: Patient, No Pcp Per Referring: No ref. provider found Visit Date: 04/08/2022 Occupation: @GUAROCC @  Subjective:  No chief complaint on file.   History of Present Illness: [name] is a 61 y.o. female ***   Activities of Daily Living:  Patient reports morning stiffness for *** {minute/hour:19697}.   Patient {ACTIONS;DENIES/REPORTS:21021675::"Denies"} nocturnal pain.  Difficulty dressing/grooming: {ACTIONS;DENIES/REPORTS:21021675::"Denies"} Difficulty climbing stairs: {ACTIONS;DENIES/REPORTS:21021675::"Denies"} Difficulty getting out of chair: {ACTIONS;DENIES/REPORTS:21021675::"Denies"} Difficulty using hands for taps, buttons, cutlery, and/or writing: {ACTIONS;DENIES/REPORTS:21021675::"Denies"}  No Rheumatology ROS completed.   PMFS History:  Patient Active Problem List   Diagnosis Date Noted   Pain in both hands 08/27/2016   High risk medication use 08/27/2016   Psoriatic arthritis (HCC) 08/27/2016   History of obesity 08/27/2016   Other psoriasis 08/27/2016   Methotrexate, long term, current use 06/22/2016    Past Medical History:  Diagnosis Date   Migraines    Osteoarthritis    Psoriasis    Psoriatic arthritis (HCC)     Family History  Problem Relation Age of Onset   Cancer Mother        Lung   Heart disease Father    Healthy Son    Healthy Son    Past Surgical History:  Procedure Laterality Date   CESAREAN SECTION  1995, 1998   Social History   Social History Narrative   Not on file   Immunization History  Administered Date(s) Administered   PFIZER(Purple Top)SARS-COV-2 Vaccination 07/09/2019, 07/30/2019, 03/11/2020, 11/11/2020     Objective: Vital Signs: LMP 07/15/2013    Physical Exam   Musculoskeletal Exam: ***  CDAI Exam: CDAI Score: -- Patient Global: --; Provider Global: -- Swollen: --; Tender:  -- Joint Exam 04/08/2022   No joint exam has been documented for this visit   There is currently no information documented on the homunculus. Go to the Rheumatology activity and complete the homunculus joint exam.  Investigation: No additional findings.  Imaging: No results found.  Recent Labs: Lab Results  Component Value Date   WBC 8.8 02/27/2022   HGB 13.3 02/27/2022   PLT 317 02/27/2022   NA 139 02/27/2022   K 5.0 02/27/2022   CL 103 02/27/2022   CO2 29 02/27/2022   GLUCOSE 99 02/27/2022   BUN 18 02/27/2022   CREATININE 0.87 02/27/2022   BILITOT 0.5 02/27/2022   ALKPHOS 108 01/10/2017   AST 17 02/27/2022   ALT 12 02/27/2022   PROT 6.6 02/27/2022   ALBUMIN 3.9 01/10/2017   CALCIUM 9.0 02/27/2022   GFRAA 96 10/25/2020    Speciality Comments: PLQ Eye Exam 05/07/17 WNL  Procedures:  No procedures performed Allergies: Penicillins   Assessment / Plan:     Visit Diagnoses: No diagnosis found.  Orders: No orders of the defined types were placed in this encounter.  No orders of the defined types were placed in this encounter.   Face-to-face time spent with patient was *** minutes. Greater than 50% of time was spent in counseling and coordination of care.  Follow-Up Instructions: No follow-ups on file.   05/09/17, CMA  Note - This record has been created using Ellen Henri.  Chart creation errors have been sought, but may not always  have  been located. Such creation errors do not reflect on  the standard of medical care.

## 2022-04-08 ENCOUNTER — Ambulatory Visit: Payer: BC Managed Care – PPO | Admitting: Physician Assistant

## 2022-04-08 DIAGNOSIS — L4050 Arthropathic psoriasis, unspecified: Secondary | ICD-10-CM

## 2022-04-08 DIAGNOSIS — Z13820 Encounter for screening for osteoporosis: Secondary | ICD-10-CM

## 2022-04-08 DIAGNOSIS — Z79899 Other long term (current) drug therapy: Secondary | ICD-10-CM

## 2022-04-08 DIAGNOSIS — L408 Other psoriasis: Secondary | ICD-10-CM

## 2022-04-08 DIAGNOSIS — M7062 Trochanteric bursitis, left hip: Secondary | ICD-10-CM

## 2022-05-02 NOTE — Progress Notes (Signed)
Office Visit Note  Patient: [name]             Date of Birth: 01/23/1961           MRN: [id number]             PCP: Patient, No Pcp Per Referring: No ref. provider found Visit Date: 05/15/2022 Occupation: @GUAROCC @  Subjective:  Medication monitoring   History of Present Illness: [name] is a 61 y.o. female with history of psoriatic arthritis.  Patient remains on methotrexate 4 tablets by mouth once weekly and folic acid 1 mg daily.  She has adding methotrexate without any side effects and has not missed any doses recently.  She denies any signs or symptoms of a psoriatic arthritis flare.  She has no active psoriasis at this time.  She has not had any increased joint pain or joint swelling.  She denies any Achilles tendinitis or plantar fasciitis.  She denies any SI joint discomfort.  She has occasional discomfort in her lower back which is typically exacerbated by not practicing yoga as frequently.  She has not had any symptoms of sciatica.  She denies any signs or symptoms of uveitis.  She denies any recent or recurrent infections.  Patient reports that she had the annual flu shot and COVID booster as well as the first Shingrix vaccine dose.  She denies any new medical conditions. She remains active walking on the treadmill for exercise and practicing yoga.     Activities of Daily Living:  Patient reports morning stiffness for 5 minutes.   Patient Denies nocturnal pain.  Difficulty dressing/grooming: Denies Difficulty climbing stairs: Denies Difficulty getting out of chair: Denies Difficulty using hands for taps, buttons, cutlery, and/or writing: Denies  Review of Systems  Constitutional:  Negative for fatigue.  HENT:  Negative for mouth sores and mouth dryness.   Eyes:  Negative for dryness.  Respiratory:  Negative for shortness of breath.   Cardiovascular:  Negative for chest pain and palpitations.  Gastrointestinal:  Negative for blood in stool,  constipation and diarrhea.  Endocrine: Negative for increased urination.  Genitourinary:  Negative for involuntary urination.  Musculoskeletal:  Negative for joint pain, gait problem, joint pain, joint swelling, myalgias, muscle weakness, morning stiffness, muscle tenderness and myalgias.  Skin:  Negative for color change, rash, hair loss and sensitivity to sunlight.  Allergic/Immunologic: Negative for susceptible to infections.  Neurological:  Negative for dizziness and headaches.  Hematological:  Negative for swollen glands.  Psychiatric/Behavioral:  Negative for depressed mood and sleep disturbance. The patient is not nervous/anxious.     PMFS History:  Patient Active Problem List   Diagnosis Date Noted   Pain in both hands 08/27/2016   High risk medication use 08/27/2016   Psoriatic arthritis ([place]) 08/27/2016   History of obesity 08/27/2016   Other psoriasis 08/27/2016   Methotrexate, long term, current use 06/22/2016    Past Medical History:  Diagnosis Date   Migraines    Osteoarthritis    Psoriasis    Psoriatic arthritis ([place])     Family History  Problem Relation Age of Onset   Cancer Mother        Lung   Heart disease Father    Healthy Son    Healthy Son    Past Surgical History:  Procedure Laterality Date   CESAREAN SECTION  1995, 1998   Social History   Social History Narrative   Not on file   Immunization History  Administered Date(s) Administered   PFIZER(Purple Top)SARS-COV-2 Vaccination 07/09/2019, 07/30/2019, 03/11/2020, 11/11/2020     Objective: Vital Signs: BP 125/75 (BP Location: Left Arm, Patient Position: Sitting, Cuff Size: Normal)   Pulse (!) 58   Resp 14   Ht 5\' 3"  (1.6 m)   Wt 202 lb 9.6 oz (91.9 kg)   LMP 07/15/2013   BMI 35.89 kg/m    Physical Exam Vitals and nursing note reviewed.  Constitutional:      Appearance: She is well-developed.  HENT:     Head: Normocephalic and atraumatic.  Eyes:     Conjunctiva/sclera:  Conjunctivae normal.  Cardiovascular:     Rate and Rhythm: Normal rate and regular rhythm.     Heart sounds: Normal heart sounds.  Pulmonary:     Effort: Pulmonary effort is normal.     Breath sounds: Normal breath sounds.  Abdominal:     General: Bowel sounds are normal.     Palpations: Abdomen is soft.  Musculoskeletal:     Cervical back: Normal range of motion.  Skin:    General: Skin is warm and dry.     Capillary Refill: Capillary refill takes less than 2 seconds.  Neurological:     Mental Status: She is alert and oriented to person, place, and time.  Psychiatric:        Behavior: Behavior normal.      Musculoskeletal Exam: C-spine, thoracic spine, lumbar spine have good range of motion.  No midline spinal tenderness.  No SI joint tenderness upon palpation.  Shoulder joints, elbow joints, wrist joints, MCPs, PIPs, DIPs have good range of motion with no synovitis.  Complete fist formation bilaterally.  Some PIP and DIP thickening noted.  Hip joints have good range of motion with no groin pain.  No tenderness over trochanteric bursa bilaterally.  Knee joints have good range of motion with no warmth or effusion.  Ankle joints have good range of motion with no tenderness or joint swelling.  No evidence of Achilles tendinitis or plantar fasciitis.  No tenderness or synovitis over MTP joints.  CDAI Exam: CDAI Score: -- Patient Global: --; Provider Global: -- Swollen: --; Tender: -- Joint Exam 05/15/2022   No joint exam has been documented for this visit   There is currently no information documented on the homunculus. Go to the Rheumatology activity and complete the homunculus joint exam.  Investigation: No additional findings.  Imaging: No results found.  Recent Labs: Lab Results  Component Value Date   WBC 8.8 02/27/2022   HGB 13.3 02/27/2022   PLT 317 02/27/2022   NA 139 02/27/2022   K 5.0 02/27/2022   CL 103 02/27/2022   CO2 29 02/27/2022   GLUCOSE 99 02/27/2022    BUN 18 02/27/2022   CREATININE 0.87 02/27/2022   BILITOT 0.5 02/27/2022   ALKPHOS 108 01/10/2017   AST 17 02/27/2022   ALT 12 02/27/2022   PROT 6.6 02/27/2022   ALBUMIN 3.9 01/10/2017   CALCIUM 9.0 02/27/2022   GFRAA 96 10/25/2020    Speciality Comments: PLQ Eye Exam 05/07/17 WNL  Procedures:  No procedures performed Allergies: Penicillins   Assessment / Plan:     Visit Diagnoses: Psoriatic arthritis (HCC): She has no synovitis or dactylitis on examination today.  She has not had any signs or symptoms of a psoriatic arthritis flare.  She has clinically been doing well taking methotrexate 4 tablets by mouth once weekly and folic acid 1 mg daily.  She is tolerating methotrexate without  any side effects and has not missed any doses recently.  She has not had any recent or recurrent infections.  She has not had any signs or symptoms of uveitis, Achilles tendinitis, plantar fasciitis, psoriasis, or SI joint involvement.  She will remain on methotrexate as monotherapy.  She was advised to notify us if she develops signs or symptoms of a flare.  She will follow-up in the office in 5 months or sooner if needed.  High risk medication use - Methotrexate 4 tablets by mouth every week and folic acid 1 mg by mouth daily. CBC and CMP updated on 02/27/22.  Orders for CBC and CMP released today.  Her next lab work will be due in April and every 3 months.  Standing orders for CBC and CMP remain in place. No recent or recurrent infections.  Discussed the importance of holding methotrexate if she develops signs or symptoms of an infection and to resume once the infection has completely cleared.  Up-to-date with annual flu shot, COVID-19 booster, and first Shingrix vaccine.  - Plan: CBC with Differential/Platelet, COMPLETE METABOLIC PANEL WITH GFR  Psoriasis: She has no active psoriasis at this time.  Trochanteric bursitis, left hip: Resolved.  Left hand paresthesia: Resolved.  Orders: Orders Placed  This Encounter  Procedures   CBC with Differential/Platelet   COMPLETE METABOLIC PANEL WITH GFR   No orders of the defined types were placed in this encounter.     Follow-Up Instructions: Return in about 5 months (around 10/14/2022) for Psoriatic arthritis.   Ofilia Neas, PA-C  Note - This record has been created using Dragon software.  Chart creation errors have been sought, but may not always  have been located. Such creation errors do not reflect on  the standard of medical care.

## 2022-05-15 ENCOUNTER — Encounter: Payer: Self-pay | Admitting: Physician Assistant

## 2022-05-15 ENCOUNTER — Ambulatory Visit: Payer: BC Managed Care – PPO | Attending: Physician Assistant | Admitting: Physician Assistant

## 2022-05-15 VITALS — BP 125/75 | HR 58 | Resp 14 | Ht 63.0 in | Wt 202.6 lb

## 2022-05-15 DIAGNOSIS — L409 Psoriasis, unspecified: Secondary | ICD-10-CM | POA: Diagnosis not present

## 2022-05-15 DIAGNOSIS — Z79899 Other long term (current) drug therapy: Secondary | ICD-10-CM

## 2022-05-15 DIAGNOSIS — M7062 Trochanteric bursitis, left hip: Secondary | ICD-10-CM

## 2022-05-15 DIAGNOSIS — L4050 Arthropathic psoriasis, unspecified: Secondary | ICD-10-CM | POA: Diagnosis not present

## 2022-05-15 DIAGNOSIS — R202 Paresthesia of skin: Secondary | ICD-10-CM

## 2022-05-15 NOTE — Patient Instructions (Signed)
Standing Labs We placed an order today for your standing lab work.   Please have your standing labs drawn in April and every 3 months   Please have your labs drawn 2 weeks prior to your appointment so that the provider can discuss your lab results at your appointment.  Please note that you may see your imaging and lab results in MyChart before we have reviewed them. We will contact you once all results are reviewed. Please allow our office up to 72 hours to thoroughly review all of the results before contacting the office for clarification of your results.  Lab hours are:   Monday through Thursday from 8:00 am -12:30 pm and 1:00 pm-5:00 pm and Friday from 8:00 am-12:00 pm.  Please be advised, all patients with office appointments requiring lab work will take precedent over walk-in lab work.   Labs are drawn by [facility]. Please bring your co-pay at the time of your lab draw.  You may receive a bill from [facility] for your lab work.  Please note if you are on Hydroxychloroquine and and an order has been placed for a Hydroxychloroquine level, you will need to have it drawn 4 hours or more after your last dose.  If you wish to have your labs drawn at another location, please call the office 24 hours in advance so we can fax the orders.  The office is located at [address], [address], [place], [place] [address] No appointment is necessary.    If you have any questions regarding directions or hours of operation,  please call [phone].   As a reminder, please drink plenty of water prior to coming for your lab work. Thanks!  If you have signs or symptoms of an infection or start antibiotics: First, call your PCP for workup of your infection. Hold your medication through the infection, until you complete your antibiotics, and until symptoms resolve if you take the following: Injectable medication (Actemra, Benlysta, Cimzia, Cosentyx, Enbrel, Humira, Kevzara, Orencia, Remicade, Simponi,  Stelara, Taltz, Tremfya) Methotrexate Leflunomide (Arava) Mycophenolate (Cellcept) Xeljanz, Olumiant, or Rinvoq  Vaccines You are taking a medication(s) that can suppress your immune system.  The following immunizations are recommended: Flu annually Covid-19  Td/Tdap (tetanus, diphtheria, pertussis) every 10 years Pneumonia (Prevnar 15 then Pneumovax 23 at least 1 year apart.  Alternatively, can take Prevnar 20 without needing additional dose) Shingrix: 2 doses from 4 weeks to 6 months apart  Please check with your PCP to make sure you are up to date.   

## 2022-05-16 LAB — COMPLETE METABOLIC PANEL WITH GFR
AG Ratio: 1.3 (calc) (ref 1.0–2.5)
ALT: 12 U/L (ref 6–29)
AST: 16 U/L (ref 10–35)
Albumin: 3.9 g/dL (ref 3.6–5.1)
Alkaline phosphatase (APISO): 93 U/L (ref 37–153)
BUN: 18 mg/dL (ref 7–25)
CO2: 28 mmol/L (ref 20–32)
Calcium: 9 mg/dL (ref 8.6–10.4)
Chloride: 107 mmol/L (ref 98–110)
Creat: 0.85 mg/dL (ref 0.50–1.03)
Globulin: 2.9 g/dL (calc) (ref 1.9–3.7)
Glucose, Bld: 101 mg/dL — ABNORMAL HIGH (ref 65–99)
Potassium: 4.8 mmol/L (ref 3.5–5.3)
Sodium: 144 mmol/L (ref 135–146)
Total Bilirubin: 0.6 mg/dL (ref 0.2–1.2)
Total Protein: 6.8 g/dL (ref 6.1–8.1)
eGFR: 79 mL/min/{1.73_m2} (ref >=60)

## 2022-05-16 LAB — CBC WITH DIFFERENTIAL/PLATELET
Absolute Monocytes: 780 cells/uL (ref 200–950)
Basophils Absolute: 38 cells/uL (ref 0–200)
Basophils Relative: 0.4 %
Eosinophils Absolute: 179 cells/uL (ref 15–500)
Eosinophils Relative: 1.9 %
HCT: 37.9 % (ref 35.0–45.0)
Hemoglobin: 13 g/dL (ref 11.7–15.5)
Lymphs Abs: 2472 cells/uL (ref 850–3900)
MCH: 32.8 pg (ref 27.0–33.0)
MCHC: 34.3 g/dL (ref 32.0–36.0)
MCV: 95.7 fL (ref 80.0–100.0)
MPV: 10.6 fL (ref 7.5–12.5)
Monocytes Relative: 8.3 %
Neutro Abs: 5931 cells/uL (ref 1500–7800)
Neutrophils Relative %: 63.1 %
Platelets: 306 10*3/uL (ref 140–400)
RBC: 3.96 10*6/uL (ref 3.80–5.10)
RDW: 13.6 % (ref 11.0–15.0)
Total Lymphocyte: 26.3 %
WBC: 9.4 10*3/uL (ref 3.8–10.8)

## 2022-05-16 NOTE — Progress Notes (Signed)
CBC and CMP WNL

## 2022-05-23 ENCOUNTER — Other Ambulatory Visit: Payer: Self-pay | Admitting: Physician Assistant

## 2022-05-23 NOTE — Telephone Encounter (Signed)
Next Visit: 10/30/2022  Last Visit: 05/15/2022  Last Fill: 02/28/2022  DX: Psoriatic arthritis   Current Dose per office note 05/15/2022: Methotrexate 4 tablets by mouth every week   Labs: 05/15/2022 CBC and CMP WNL   Okay to refill MTX?

## 2022-08-18 ENCOUNTER — Other Ambulatory Visit: Payer: Self-pay | Admitting: Rheumatology

## 2022-08-19 ENCOUNTER — Encounter: Payer: Self-pay | Admitting: *Deleted

## 2022-08-19 NOTE — Telephone Encounter (Signed)
Last Fill: 05/23/2022  Labs: 05/15/2022 CBC and CMP WNL   Next Visit: 10/30/2022  Last Visit: 05/15/2022  DX: Psoriatic arthritis   Current Dose per office note 05/15/2022: Methotrexate 4 tablets by mouth every week   Message sent to patient via my chart to advise patient she is due to update labs.   Okay to refill Methotrexate?

## 2022-09-20 ENCOUNTER — Other Ambulatory Visit: Payer: Self-pay | Admitting: Physician Assistant

## 2022-09-25 ENCOUNTER — Other Ambulatory Visit: Payer: Self-pay

## 2022-09-25 DIAGNOSIS — Z79899 Other long term (current) drug therapy: Secondary | ICD-10-CM

## 2022-09-25 LAB — CBC WITH DIFFERENTIAL/PLATELET
Absolute Monocytes: 721 cells/uL (ref 200–950)
Basophils Absolute: 80 cells/uL (ref 0–200)
HCT: 37.1 % (ref 35.0–45.0)
MCHC: 33.7 g/dL (ref 32.0–36.0)
MPV: 10.4 fL (ref 7.5–12.5)
Monocytes Relative: 8.1 %
Neutrophils Relative %: 58.9 %
RDW: 13.9 % (ref 11.0–15.0)

## 2022-09-26 LAB — CBC WITH DIFFERENTIAL/PLATELET
Basophils Relative: 0.9 %
Eosinophils Absolute: 312 cells/uL (ref 15–500)
Eosinophils Relative: 3.5 %
Hemoglobin: 12.5 g/dL (ref 11.7–15.5)
Lymphs Abs: 2545 cells/uL (ref 850–3900)
MCH: 32.9 pg (ref 27.0–33.0)
MCV: 97.6 fL (ref 80.0–100.0)
Neutro Abs: 5242 cells/uL (ref 1500–7800)
Platelets: 283 10*3/uL (ref 140–400)
RBC: 3.8 10*6/uL (ref 3.80–5.10)
Total Lymphocyte: 28.6 %
WBC: 8.9 10*3/uL (ref 3.8–10.8)

## 2022-09-26 LAB — COMPLETE METABOLIC PANEL WITH GFR
AG Ratio: 1.3 (calc) (ref 1.0–2.5)
ALT: 11 U/L (ref 6–29)
AST: 16 U/L (ref 10–35)
Albumin: 3.7 g/dL (ref 3.6–5.1)
Alkaline phosphatase (APISO): 103 U/L (ref 37–153)
BUN: 10 mg/dL (ref 7–25)
CO2: 29 mmol/L (ref 20–32)
Calcium: 9 mg/dL (ref 8.6–10.4)
Chloride: 107 mmol/L (ref 98–110)
Creat: 0.77 mg/dL (ref 0.50–1.03)
Globulin: 2.9 g/dL (calc) (ref 1.9–3.7)
Glucose, Bld: 100 mg/dL — ABNORMAL HIGH (ref 65–99)
Potassium: 5.1 mmol/L (ref 3.5–5.3)
Sodium: 142 mmol/L (ref 135–146)
Total Bilirubin: 0.4 mg/dL (ref 0.2–1.2)
Total Protein: 6.6 g/dL (ref 6.1–8.1)
eGFR: 89 mL/min/{1.73_m2} (ref >=60)

## 2022-09-26 NOTE — Progress Notes (Signed)
CBC and CMP normal

## 2022-09-27 ENCOUNTER — Other Ambulatory Visit: Payer: Self-pay | Admitting: Physician Assistant

## 2022-09-27 NOTE — Telephone Encounter (Signed)
Last Fill: 08/19/2022  Labs: 09/25/2022  CBC and CMP normal.   Next Visit: 10/30/2022  Last Visit: 05/15/2022  DX:  Psoriatic arthritis   Current Dose per office note 05/15/2022: Methotrexate 4 tablets by mouth every week   Okay to refill Methotrexate?

## 2022-10-16 NOTE — Progress Notes (Signed)
Office Visit Note  Patient: [name]             Date of Birth: [birth year]           MRN: [id number]             PCP: Patient, No Pcp Per Referring: No ref. provider found Visit Date: 10/30/2022 Occupation: @GUAROCC @  Subjective:  Medication monitoring  History of Present Illness: [name] is a 62 y.o. female with history of psoriatic arthritis.  Patient remains on Methotrexate 4 tablets by mouth every week and folic acid 1 mg by mouth daily.  She continues to tolerate methotrexate without any side effects and has not missed any doses recently.  She denies any signs or symptoms of a psoriatic arthritis flare.  She denies any active psoriasis at this time.  She denies any increased joint pain or joint swelling.  She has morning stiffness lasting for about 10 minutes daily.  She denies any Achilles tendinitis or plantar fasciitis.  She denies any SI joint pain at this time.  Patient states that she was experiencing some discomfort in the right piriformis muscle in the late spring which has since resolved. She denies any recent or recurrent infections.   Activities of Daily Living:  Patient reports morning stiffness for 10 minutes.   Patient Denies nocturnal pain.  Difficulty dressing/grooming: Denies Difficulty climbing stairs: Denies Difficulty getting out of chair: Denies Difficulty using hands for taps, buttons, cutlery, and/or writing: Denies  Review of Systems  Constitutional:  Negative for fatigue.  HENT:  Negative for mouth sores and mouth dryness.   Eyes:  Negative for dryness.  Respiratory:  Negative for shortness of breath.   Cardiovascular:  Negative for chest pain and palpitations.  Gastrointestinal:  Negative for blood in stool, constipation and diarrhea.  Endocrine: Negative for increased urination.  Genitourinary:  Negative for involuntary urination.  Musculoskeletal:  Positive for morning stiffness. Negative for joint pain, gait problem, joint  pain, joint swelling, myalgias, muscle weakness, muscle tenderness and myalgias.  Skin:  Negative for color change, rash, hair loss and sensitivity to sunlight.  Allergic/Immunologic: Negative for susceptible to infections.  Neurological:  Negative for dizziness and headaches.  Hematological:  Negative for swollen glands.  Psychiatric/Behavioral:  Negative for depressed mood and sleep disturbance. The patient is not nervous/anxious.     PMFS History:  Patient Active Problem List   Diagnosis Date Noted   Pain in both hands 08/27/2016   High risk medication use 08/27/2016   Psoriatic arthritis (HCC) 08/27/2016   History of obesity 08/27/2016   Other psoriasis 08/27/2016   Methotrexate, long term, current use 06/22/2016    Past Medical History:  Diagnosis Date   Migraines    Osteoarthritis    Psoriasis    Psoriatic arthritis (HCC)     Family History  Problem Relation Age of Onset   Cancer Mother        Lung   Heart disease Father    Healthy Son    Healthy Son    Past Surgical History:  Procedure Laterality Date   CESAREAN SECTION  1995, 1998   Social History   Social History Narrative   Not on file   Immunization History  Administered Date(s) Administered   PFIZER(Purple Top)SARS-COV-2 Vaccination 07/09/2019, 07/30/2019, 03/11/2020, 11/11/2020     Objective: Vital Signs: BP 117/71 (BP Location: Left Arm, Patient Position: Sitting, Cuff Size: Normal)   Pulse 62   Resp 15   Ht  5\' 3"  (1.6 m)   Wt 192 lb 12.8 oz (87.5 kg)   LMP 07/15/2013   BMI 34.15 kg/m    Physical Exam Vitals and nursing note reviewed.  Constitutional:      Appearance: She is well-developed.  HENT:     Head: Normocephalic and atraumatic.  Eyes:     Conjunctiva/sclera: Conjunctivae normal.  Cardiovascular:     Rate and Rhythm: Normal rate and regular rhythm.     Heart sounds: Normal heart sounds.  Pulmonary:     Effort: Pulmonary effort is normal.     Breath sounds: Normal breath  sounds.  Abdominal:     General: Bowel sounds are normal.     Palpations: Abdomen is soft.  Musculoskeletal:     Cervical back: Normal range of motion.  Lymphadenopathy:     Cervical: No cervical adenopathy.  Skin:    General: Skin is warm and dry.     Capillary Refill: Capillary refill takes less than 2 seconds.  Neurological:     Mental Status: She is alert and oriented to person, place, and time.  Psychiatric:        Behavior: Behavior normal.      Musculoskeletal Exam: C-spine, thoracic spine, lumbar spine have good range of motion.  No midline spinal tenderness.  No SI joint tenderness.  Shoulder joints, elbow joints, wrist joints, MCPs, PIPs, DIPs have good range of motion with no synovitis.  Complete fist formation bilaterally.  Hip joints have good range of motion with no groin pain.  Knee joints have good range of motion with no warmth or effusion.  Ankle joints have good range of motion with no tenderness or joint swelling.  No tenderness over the trochanteric bursa bilaterally.  No evidence of Achilles tendinitis or plantar fasciitis.  CDAI Exam: CDAI Score: -- Patient Global: --; Provider Global: -- Swollen: --; Tender: -- Joint Exam 10/30/2022   No joint exam has been documented for this visit   There is currently no information documented on the homunculus. Go to the Rheumatology activity and complete the homunculus joint exam.  Investigation: No additional findings.  Imaging: No results found.  Recent Labs: Lab Results  Component Value Date   WBC 8.9 09/25/2022   HGB 12.5 09/25/2022   PLT 283 09/25/2022   NA 142 09/25/2022   K 5.1 09/25/2022   CL 107 09/25/2022   CO2 29 09/25/2022   GLUCOSE 100 (H) 09/25/2022   BUN 10 09/25/2022   CREATININE 0.77 09/25/2022   BILITOT 0.4 09/25/2022   ALKPHOS 108 01/10/2017   AST 16 09/25/2022   ALT 11 09/25/2022   PROT 6.6 09/25/2022   ALBUMIN 3.9 01/10/2017   CALCIUM 9.0 09/25/2022   GFRAA 96 10/25/2020     Speciality Comments: PLQ Eye Exam 05/07/17 WNL  Procedures:  No procedures performed Allergies: Penicillins   Assessment / Plan:     Visit Diagnoses: Psoriatic arthritis (HCC): No synovitis or dactylitis noted on examination today.  No active psoriasis at this time.  No evidence of Achilles tendinitis or plantar fasciitis.  No SI joint tenderness.  She has clinically been doing well on methotrexate 4 tablets by mouth once weekly along with folic acid 1 mg daily.  She has been tolerating methotrexate without any side effects and has not missed any doses recently.  Her morning stiffness is only been lasting for about 10 minutes daily.  She has not had any nocturnal pain or difficulty with ADLs.  She will remain on  low-dose methotrexate as prescribed.  She is advised to notify us if she develops signs or symptoms of a flare.  She will follow-up in the office in 5 months or sooner if needed.  Psoriasis: She has no active psoriasis at this time.  High risk medication use - Methotrexate 4 tablets by mouth every week and folic acid 1 mg by mouth daily.  CBC and CMP updated on 09/25/22.  Her next lab work will be due in August and every 3 months.  Standing orders for CBC and CMP replaced today. No recent or recurrent infections.  Discussed the importance of holding methotrexate if she develops signs or symptoms of an infection and to resume once the infection has completely cleared.  - Plan: CBC with Differential/Platelet, COMPLETE METABOLIC PANEL WITH GFR  Trochanteric bursitis, left hip - Resolved.  Left hand paresthesia - Resolved.  Orders: Orders Placed This Encounter  Procedures   CBC with Differential/Platelet   COMPLETE METABOLIC PANEL WITH GFR   No orders of the defined types were placed in this encounter.    Follow-Up Instructions: Return in about 5 months (around 04/01/2023) for Psoriatic arthritis.   Gearldine Bienenstock, PA-C  Note - This record has been created using Dragon  software.  Chart creation errors have been sought, but may not always  have been located. Such creation errors do not reflect on  the standard of medical care.

## 2022-10-30 ENCOUNTER — Ambulatory Visit: Payer: BC Managed Care – PPO | Admitting: Rheumatology

## 2022-10-30 ENCOUNTER — Ambulatory Visit: Payer: BC Managed Care – PPO | Attending: Rheumatology | Admitting: Physician Assistant

## 2022-10-30 ENCOUNTER — Encounter: Payer: Self-pay | Admitting: Physician Assistant

## 2022-10-30 VITALS — BP 117/71 | HR 62 | Resp 15 | Ht 63.0 in | Wt 192.8 lb

## 2022-10-30 DIAGNOSIS — R202 Paresthesia of skin: Secondary | ICD-10-CM

## 2022-10-30 DIAGNOSIS — Z79899 Other long term (current) drug therapy: Secondary | ICD-10-CM | POA: Diagnosis not present

## 2022-10-30 DIAGNOSIS — L409 Psoriasis, unspecified: Secondary | ICD-10-CM

## 2022-10-30 DIAGNOSIS — L4050 Arthropathic psoriasis, unspecified: Secondary | ICD-10-CM

## 2022-10-30 DIAGNOSIS — M7062 Trochanteric bursitis, left hip: Secondary | ICD-10-CM | POA: Diagnosis not present

## 2022-10-30 NOTE — Patient Instructions (Addendum)
Standing Labs We placed an order today for your standing lab work.   Please have your standing labs drawn in mid- August and every 3 months   Please have your labs drawn 2 weeks prior to your appointment so that the provider can discuss your lab results at your appointment, if possible.  Please note that you may see your imaging and lab results in MyChart before we have reviewed them. We will contact you once all results are reviewed. Please allow our office up to 72 hours to thoroughly review all of the results before contacting the office for clarification of your results.  WALK-IN LAB HOURS  Monday through Thursday from 8:00 am -12:30 pm and 1:00 pm-5:00 pm and Friday from 8:00 am-12:00 pm.  Patients with office visits requiring labs will be seen before walk-in labs.  You may encounter longer than normal wait times. Please allow additional time. Wait times may be shorter on  Monday and Thursday afternoons.  We do not book appointments for walk-in labs. We appreciate your patience and understanding with our staff.   Labs are drawn by [facility]. Please bring your co-pay at the time of your lab draw.  You may receive a bill from [facility] for your lab work.  Please note if you are on Hydroxychloroquine and and an order has been placed for a Hydroxychloroquine level,  you will need to have it drawn 4 hours or more after your last dose.  If you wish to have your labs drawn at another location, please call the office 24 hours in advance so we can fax the orders.  The office is located at [address], [place], [place] [address]   If you have any questions regarding directions or hours of operation,  please call [phone].   As a reminder, please drink plenty of water prior to coming for your lab work. Thanks!

## 2022-11-11 ENCOUNTER — Other Ambulatory Visit: Payer: Self-pay | Admitting: Nurse Practitioner

## 2022-11-11 DIAGNOSIS — Z0000 Encounter for general adult medical examination without abnormal findings: Secondary | ICD-10-CM

## 2022-11-25 ENCOUNTER — Other Ambulatory Visit: Payer: Self-pay | Admitting: Physician Assistant

## 2022-11-25 NOTE — Telephone Encounter (Signed)
Last Fill: 11/06/2022  Next Visit: 04/03/2023  Last Visit: 10/30/2022  Dx: Psoriatic arthritis   Current Dose per office note on 10/30/2022: folic acid 1 mg by mouth daily.   Okay to refill Folic Acid?

## 2022-11-29 ENCOUNTER — Ambulatory Visit
Admission: RE | Admit: 2022-11-29 | Discharge: 2022-11-29 | Disposition: A | Discharge status: Home / Self Care | Payer: BC Managed Care – PPO | Source: Ambulatory Visit | Attending: Nurse Practitioner | Admitting: Nurse Practitioner

## 2022-11-29 DIAGNOSIS — Z0000 Encounter for general adult medical examination without abnormal findings: Secondary | ICD-10-CM

## 2022-12-21 ENCOUNTER — Other Ambulatory Visit: Payer: Self-pay | Admitting: Rheumatology

## 2022-12-23 NOTE — Telephone Encounter (Signed)
Last Fill: 09/29/2022  Labs: 09/25/2022 CBC and CMP normal.   Next Visit: 04/03/2023  Last Visit: 10/30/2022  DX:  Psoriatic arthritis   Current Dose per office note 10/30/2022: Methotrexate 4 tablets by mouth every week   Patient advised she is due to update labs.   Okay to refill Methotrexate?

## 2023-02-28 ENCOUNTER — Other Ambulatory Visit: Payer: Self-pay | Admitting: *Deleted

## 2023-02-28 DIAGNOSIS — Z79899 Other long term (current) drug therapy: Secondary | ICD-10-CM

## 2023-03-01 LAB — COMPLETE METABOLIC PANEL WITH GFR
AG Ratio: 1.3 (calc) (ref 1.0–2.5)
ALT: 10 U/L (ref 6–29)
AST: 16 U/L (ref 10–35)
Albumin: 3.7 g/dL (ref 3.6–5.1)
Alkaline phosphatase (APISO): 103 U/L (ref 37–153)
BUN: 17 mg/dL (ref 7–25)
CO2: 28 mmol/L (ref 20–32)
Calcium: 9.1 mg/dL (ref 8.6–10.4)
Chloride: 105 mmol/L (ref 98–110)
Creat: 0.84 mg/dL (ref 0.50–1.05)
Globulin: 2.8 g/dL (ref 1.9–3.7)
Glucose, Bld: 100 mg/dL — ABNORMAL HIGH (ref 65–99)
Potassium: 4.8 mmol/L (ref 3.5–5.3)
Sodium: 139 mmol/L (ref 135–146)
Total Bilirubin: 0.5 mg/dL (ref 0.2–1.2)
Total Protein: 6.5 g/dL (ref 6.1–8.1)
eGFR: 80 mL/min/{1.73_m2} (ref >=60)

## 2023-03-01 LAB — CBC WITH DIFFERENTIAL/PLATELET
Absolute Lymphocytes: 1736 {cells}/uL (ref 850–3900)
Absolute Monocytes: 733 {cells}/uL (ref 200–950)
Basophils Absolute: 73 {cells}/uL (ref 0–200)
Basophils Relative: 1.1 %
Eosinophils Absolute: 363 {cells}/uL (ref 15–500)
Eosinophils Relative: 5.5 %
HCT: 38.2 % (ref 35.0–45.0)
Hemoglobin: 12.6 g/dL (ref 11.7–15.5)
MCH: 32.5 pg (ref 27.0–33.0)
MCHC: 33 g/dL (ref 32.0–36.0)
MCV: 98.5 fL (ref 80.0–100.0)
MPV: 10.2 fL (ref 7.5–12.5)
Monocytes Relative: 11.1 %
Neutro Abs: 3696 {cells}/uL (ref 1500–7800)
Neutrophils Relative %: 56 %
Platelets: 286 10*3/uL (ref 140–400)
RBC: 3.88 10*6/uL (ref 3.80–5.10)
RDW: 13.4 % (ref 11.0–15.0)
Total Lymphocyte: 26.3 %
WBC: 6.6 10*3/uL (ref 3.8–10.8)

## 2023-03-03 NOTE — Progress Notes (Signed)
CBC and CMP WNL

## 2023-03-19 ENCOUNTER — Other Ambulatory Visit: Payer: Self-pay | Admitting: Physician Assistant

## 2023-03-19 NOTE — Telephone Encounter (Signed)
Last Fill: 12/23/2022  Labs: 02/28/2023 CBC and CMP WNL   Next Visit: 04/03/2023  Last Visit: 10/30/2022  DX: Psoriatic arthritis   Current Dose per office note 10/30/2022: Methotrexate 4 tablets by mouth every week   Okay to refill Methotrexate?

## 2023-03-20 NOTE — Progress Notes (Unsigned)
Office Visit Note  Patient: [name]             Date of Birth: 04-05-1961           MRN: [id number]             PCP: Patient, No Pcp Per Referring: No ref. provider found Visit Date: 04/03/2023 Occupation: @GUAROCC @  Subjective:  Medication monitoring   History of Present Illness: [name] is a 62 y.o. female with history of psoriatic arthritis.  Patient is currently taking Methotrexate 4 tablets by mouth every week and folic acid 1 mg by mouth daily.  She continues to tolerate methotrexate without any side effects.  She denies any recent gaps in therapy.  She denies any recent or recurrent infections.  She denies any signs or symptoms of a flare.  She has not currently experiencing any joint pain or joint swelling.  Her morning stiffness has been lasting less than 5 minutes daily.  She denies any psoriasis at this time.  She denies any nocturnal pain. Patient is planning on getting the annual flu shot and COVID-vaccine.  Activities of Daily Living:  Patient reports morning stiffness for 5 or less minutes.   Patient Denies nocturnal pain.  Difficulty dressing/grooming: Denies Difficulty climbing stairs: Denies Difficulty getting out of chair: Denies Difficulty using hands for taps, buttons, cutlery, and/or writing: Denies  Review of Systems  Constitutional:  Negative for fatigue.  HENT:  Negative for mouth sores and mouth dryness.   Eyes:  Negative for dryness.  Respiratory:  Negative for shortness of breath.   Cardiovascular:  Negative for chest pain and palpitations.  Gastrointestinal:  Negative for blood in stool, constipation and diarrhea.  Endocrine: Negative for increased urination.  Genitourinary:  Negative for involuntary urination.  Musculoskeletal:  Positive for morning stiffness. Negative for joint pain, gait problem, joint pain, joint swelling, myalgias, muscle weakness, muscle tenderness and myalgias.  Skin:  Negative for color change, rash, hair  loss and sensitivity to sunlight.  Allergic/Immunologic: Negative for susceptible to infections.  Neurological:  Negative for dizziness and headaches.  Hematological:  Negative for swollen glands.  Psychiatric/Behavioral:  Negative for depressed mood and sleep disturbance. The patient is not nervous/anxious.     PMFS History:  Patient Active Problem List   Diagnosis Date Noted   Pain in both hands 08/27/2016   High risk medication use 08/27/2016   Psoriatic arthritis (HCC) 08/27/2016   History of obesity 08/27/2016   Other psoriasis 08/27/2016   Methotrexate, long term, current use 06/22/2016    Past Medical History:  Diagnosis Date   Migraines    Osteoarthritis    Psoriasis    Psoriatic arthritis (HCC)     Family History  Problem Relation Age of Onset   Cancer Mother        Lung   Heart disease Father    Healthy Son    Healthy Son    Past Surgical History:  Procedure Laterality Date   CESAREAN SECTION  1995, 1998   Social History   Social History Narrative   Not on file   Immunization History  Administered Date(s) Administered   PFIZER(Purple Top)SARS-COV-2 Vaccination 07/09/2019, 07/30/2019, 03/11/2020, 11/11/2020     Objective: Vital Signs: BP 105/65 (BP Location: Left Arm, Patient Position: Sitting, Cuff Size: Normal)   Pulse (!) 52   Resp 14   Ht 5\' 3"  (1.6 m)   Wt 188 lb (85.3 kg)   LMP 07/15/2013   BMI  33.30 kg/m    Physical Exam Vitals and nursing note reviewed.  Constitutional:      Appearance: She is well-developed.  HENT:     Head: Normocephalic and atraumatic.  Eyes:     Conjunctiva/sclera: Conjunctivae normal.  Cardiovascular:     Rate and Rhythm: Normal rate and regular rhythm.     Heart sounds: Normal heart sounds.  Pulmonary:     Effort: Pulmonary effort is normal.     Breath sounds: Normal breath sounds.  Abdominal:     General: Bowel sounds are normal.     Palpations: Abdomen is soft.  Musculoskeletal:     Cervical back:  Normal range of motion.  Lymphadenopathy:     Cervical: No cervical adenopathy.  Skin:    General: Skin is warm and dry.     Capillary Refill: Capillary refill takes less than 2 seconds.  Neurological:     Mental Status: She is alert and oriented to person, place, and time.  Psychiatric:        Behavior: Behavior normal.      Musculoskeletal Exam: C-spine, thoracic spine, lumbar spine have good range of motion.  No midline spinal tenderness.  No SI joint tenderness.  Shoulder joints, elbow joints, wrist joints, MCPs, PIPs, DIPs have good range of motion with no synovitis.  Complete fist formation bilaterally.  Hip joints have good range of motion with no groin pain.  Knee joints have good range of motion no warmth or effusion.  Ankle joints have good range of motion with no tenderness or joint swelling.  No evidence of achilles tendonitis or plantar fasciitis.   CDAI Exam: CDAI Score: -- Patient Global: --; Provider Global: -- Swollen: --; Tender: -- Joint Exam 04/03/2023   No joint exam has been documented for this visit   There is currently no information documented on the homunculus. Go to the Rheumatology activity and complete the homunculus joint exam.  Investigation: No additional findings.  Imaging: No results found.  Recent Labs: Lab Results  Component Value Date   WBC 6.6 02/28/2023   HGB 12.6 02/28/2023   PLT 286 02/28/2023   NA 139 02/28/2023   K 4.8 02/28/2023   CL 105 02/28/2023   CO2 28 02/28/2023   GLUCOSE 100 (H) 02/28/2023   BUN 17 02/28/2023   CREATININE 0.84 02/28/2023   BILITOT 0.5 02/28/2023   ALKPHOS 108 01/10/2017   AST 16 02/28/2023   ALT 10 02/28/2023   PROT 6.5 02/28/2023   ALBUMIN 3.9 01/10/2017   CALCIUM 9.1 02/28/2023   GFRAA 96 10/25/2020    Speciality Comments: PLQ Eye Exam 05/07/17 WNL  Procedures:  No procedures performed Allergies: Penicillins   Assessment / Plan:     Visit Diagnoses: Psoriatic arthritis (HCC): No  synovitis or dactylitis noted on examination today.  No evidence of Achilles tendinitis or plantar fasciitis.  SI joint tenderness upon palpation.  No active psoriasis at this time.  She has not had any signs or symptoms of a flare.  Her morning stiffness has been lasting less than 5 minutes daily.  She has not had any nocturnal pain or difficulty with ADLs.  She has clinically doing well taking methotrexate 4 tablets by mouth once weekly along with folic acid 1 mg daily.  She is tolerating methotrexate without any side effects and has not had any gaps in therapy.  No recent or recurrent infections.  She will notify us if she develops signs or symptoms of a flare.  She  will follow-up in the office in 5 months or sooner if needed.  Psoriasis: No active psoriasis at this time.   High risk medication use - Methotrexate 4 tablets by mouth every week and folic acid 1 mg by mouth daily. CBC and CMP updated on 02/28/23.  Her next lab work will be due in January and every 3 months. Standing orders for CBC and CMP remain in place. No recent or recurrent infections.  Discussed the importance of holding methotrexate if she develops signs or symptoms of an infection and to resume once the infection has completely cleared.  She is planning to receive the annual flu shot and covid vaccine.   Trochanteric bursitis, left hip: Not currently symptomatic.  No tenderness upon palpation today.   Left hand paresthesia: Not currently symptomatic.   Postmenopausal  Orders: No orders of the defined types were placed in this encounter.  No orders of the defined types were placed in this encounter.   Follow-Up Instructions: Return in about 5 months (around 09/01/2023) for Psoriatic arthritis.   Gearldine Bienenstock, PA-C  Note - This record has been created using Dragon software.  Chart creation errors have been sought, but may not always  have been located. Such creation errors do not reflect on  the standard of medical  care.

## 2023-04-03 ENCOUNTER — Ambulatory Visit: Payer: BC Managed Care – PPO | Attending: Physician Assistant | Admitting: Physician Assistant

## 2023-04-03 ENCOUNTER — Encounter: Payer: Self-pay | Admitting: Physician Assistant

## 2023-04-03 VITALS — BP 105/65 | HR 52 | Resp 14 | Ht 63.0 in | Wt 188.0 lb

## 2023-04-03 DIAGNOSIS — M7062 Trochanteric bursitis, left hip: Secondary | ICD-10-CM | POA: Diagnosis not present

## 2023-04-03 DIAGNOSIS — Z79899 Other long term (current) drug therapy: Secondary | ICD-10-CM | POA: Diagnosis not present

## 2023-04-03 DIAGNOSIS — Z780 Asymptomatic menopausal state: Secondary | ICD-10-CM

## 2023-04-03 DIAGNOSIS — L4050 Arthropathic psoriasis, unspecified: Secondary | ICD-10-CM | POA: Diagnosis not present

## 2023-04-03 DIAGNOSIS — L409 Psoriasis, unspecified: Secondary | ICD-10-CM | POA: Diagnosis not present

## 2023-04-03 DIAGNOSIS — R202 Paresthesia of skin: Secondary | ICD-10-CM

## 2023-04-03 NOTE — Patient Instructions (Addendum)
Please hold Methotrexate dose 1 week after receiving flu shot and covid vaccine      Standing Labs We placed an order today for your standing lab work.   Please have your standing labs drawn in mid-January and every 3 months   Please have your labs drawn 2 weeks prior to your appointment so that the provider can discuss your lab results at your appointment, if possible.  Please note that you may see your imaging and lab results in MyChart before we have reviewed them. We will contact you once all results are reviewed. Please allow our office up to 72 hours to thoroughly review all of the results before contacting the office for clarification of your results.  WALK-IN LAB HOURS  Monday through Thursday from 8:00 am -12:30 pm and 1:00 pm-5:00 pm and Friday from 8:00 am-12:00 pm.  Patients with office visits requiring labs will be seen before walk-in labs.  You may encounter longer than normal wait times. Please allow additional time. Wait times may be shorter on  Monday and Thursday afternoons.  We do not book appointments for walk-in labs. We appreciate your patience and understanding with our staff.   Labs are drawn by [facility]. Please bring your co-pay at the time of your lab draw.  You may receive a bill from [facility] for your lab work.  Please note if you are on Hydroxychloroquine and and an order has been placed for a Hydroxychloroquine level,  you will need to have it drawn 4 hours or more after your last dose.  If you wish to have your labs drawn at another location, please call the office 24 hours in advance so we can fax the orders.  The office is located at [address], [place], [place] [address]   If you have any questions regarding directions or hours of operation,  please call [phone].   As a reminder, please drink plenty of water prior to coming for your lab work. Thanks!  If you have signs or symptoms of an infection or start antibiotics: First, call  your PCP for workup of your infection. Hold your medication through the infection, until you complete your antibiotics, and until symptoms resolve if you take the following: Injectable medication (Actemra, Benlysta, Cimzia, Cosentyx, Enbrel, Humira, Kevzara, Orencia, Remicade, Simponi, Stelara, Taltz, Tremfya) Methotrexate Leflunomide (Arava) Mycophenolate (Cellcept) [name], Olumiant, or Rinvoq    Vaccines You are taking a medication(s) that can suppress your immune system.  The following immunizations are recommended: Flu annually Covid-19  Td/Tdap (tetanus, diphtheria, pertussis) every 10 years Pneumonia (Prevnar 15 then Pneumovax 23 at least 1 year apart.  Alternatively, can take Prevnar 20 without needing additional dose) Shingrix: 2 doses from 4 weeks to 6 months apart  Please check with your PCP to make sure you are up to date.

## 2023-07-25 ENCOUNTER — Other Ambulatory Visit: Payer: Self-pay | Admitting: Physician Assistant

## 2023-07-25 NOTE — Telephone Encounter (Addendum)
 Last Fill: 03/19/2023  Labs: 02/28/2023 CBC and CMP WNL LMOM labs are due  Next Visit: 09/11/2023  Last Visit: 04/03/2023  DX: Psoriatic arthritis   Current Dose per office note 04/03/2023: Methotrexate 4 tablets by mouth every week   Okay to refill Methotrexate?

## 2023-07-28 ENCOUNTER — Other Ambulatory Visit: Payer: Self-pay | Admitting: Physician Assistant

## 2023-08-11 ENCOUNTER — Other Ambulatory Visit: Payer: Self-pay

## 2023-08-11 DIAGNOSIS — Z79899 Other long term (current) drug therapy: Secondary | ICD-10-CM

## 2023-08-12 LAB — COMPREHENSIVE METABOLIC PANEL WITH GFR
AG Ratio: 1.6 (calc) (ref 1.0–2.5)
ALT: 8 U/L (ref 6–29)
AST: 16 U/L (ref 10–35)
Albumin: 4 g/dL (ref 3.6–5.1)
Alkaline phosphatase (APISO): 90 U/L (ref 37–153)
BUN: 17 mg/dL (ref 7–25)
CO2: 28 mmol/L (ref 20–32)
Calcium: 9.1 mg/dL (ref 8.6–10.4)
Chloride: 105 mmol/L (ref 98–110)
Creat: 0.82 mg/dL (ref 0.50–1.05)
Globulin: 2.5 g/dL (ref 1.9–3.7)
Glucose, Bld: 88 mg/dL (ref 65–99)
Potassium: 5.1 mmol/L (ref 3.5–5.3)
Sodium: 140 mmol/L (ref 135–146)
Total Bilirubin: 0.5 mg/dL (ref 0.2–1.2)
Total Protein: 6.5 g/dL (ref 6.1–8.1)
eGFR: 82 mL/min/{1.73_m2} (ref >=60)

## 2023-08-12 LAB — CBC WITH DIFFERENTIAL/PLATELET
Absolute Lymphocytes: 2262 {cells}/uL (ref 850–3900)
Absolute Monocytes: 636 {cells}/uL (ref 200–950)
Basophils Absolute: 60 {cells}/uL (ref 0–200)
Basophils Relative: 0.7 %
Eosinophils Absolute: 232 {cells}/uL (ref 15–500)
Eosinophils Relative: 2.7 %
HCT: 37.3 % (ref 35.0–45.0)
Hemoglobin: 12.7 g/dL (ref 11.7–15.5)
MCH: 32.6 pg (ref 27.0–33.0)
MCHC: 34 g/dL (ref 32.0–36.0)
MCV: 95.6 fL (ref 80.0–100.0)
MPV: 10.3 fL (ref 7.5–12.5)
Monocytes Relative: 7.4 %
Neutro Abs: 5409 {cells}/uL (ref 1500–7800)
Neutrophils Relative %: 62.9 %
Platelets: 263 10*3/uL (ref 140–400)
RBC: 3.9 10*6/uL (ref 3.80–5.10)
RDW: 14.2 % (ref 11.0–15.0)
Total Lymphocyte: 26.3 %
WBC: 8.6 10*3/uL (ref 3.8–10.8)

## 2023-08-12 NOTE — Progress Notes (Signed)
 CBC and CMP are normal.

## 2023-09-02 NOTE — Progress Notes (Signed)
 Office Visit Note  Patient: [name]             Date of Birth: [birth year]           MRN: [id number]             PCP: Patient, No Pcp Per Referring: No ref. provider found Visit Date: 09/11/2023 Occupation: @GUAROCC @  Subjective:  Lower back pain   History of Present Illness: [name] is a 64 y.o. female with psoriatic arthritis, psoriasis and osteoarthritis.  She returns today after her last visit in November 2024.  She states for the last 1 month she has been having pain in the buttock region which radiates into her right hip.  She states that sitting for prolonged.  Causes increased discomfort.  She has difficulty getting up from the sitting position and also from standing to sitting position.  She also has nocturnal pain when she sleeps on her side.  None of the other joints are painful or swollen.  She has not had a psoriasis flare.  Been taking methotrexate  4 tablets weekly without any interruption.  She denies dactylitis, plantar fasciitis, Achilles tendinitis or uveitis.    Activities of Daily Living:  Patient reports morning stiffness for 5 minutes.   Patient Reports nocturnal pain.  Difficulty dressing/grooming: Denies Difficulty climbing stairs: Denies Difficulty getting out of chair: Denies Difficulty using hands for taps, buttons, cutlery, and/or writing: Denies  Review of Systems  Constitutional:  Negative for fatigue.  HENT:  Negative for mouth sores and mouth dryness.   Eyes:  Negative for dryness.  Respiratory:  Negative for shortness of breath.   Cardiovascular:  Negative for chest pain and palpitations.  Gastrointestinal:  Negative for blood in stool, constipation and diarrhea.  Endocrine: Negative for increased urination.  Genitourinary:  Negative for involuntary urination.  Musculoskeletal:  Positive for joint pain, joint pain, morning stiffness and muscle tenderness. Negative for gait problem, joint swelling, myalgias, muscle weakness and  myalgias.  Skin:  Negative for color change, rash, hair loss and sensitivity to sunlight.  Allergic/Immunologic: Negative for susceptible to infections.  Neurological:  Negative for dizziness and headaches.  Hematological:  Negative for swollen glands.  Psychiatric/Behavioral:  Negative for depressed mood and sleep disturbance. The patient is not nervous/anxious.     PMFS History:  Patient Active Problem List   Diagnosis Date Noted   Pain in both hands 08/27/2016   High risk medication use 08/27/2016   Psoriatic arthritis (HCC) 08/27/2016   History of obesity 08/27/2016   Other psoriasis 08/27/2016   Methotrexate , long term, current use 06/22/2016    Past Medical History:  Diagnosis Date   Migraines    Osteoarthritis    Psoriasis    Psoriatic arthritis (HCC)     Family History  Problem Relation Age of Onset   Cancer Mother        Lung   Heart disease Father    Healthy Son    Healthy Son    Past Surgical History:  Procedure Laterality Date   CESAREAN SECTION  1995, 1998   Social History   Social History Narrative   Not on file   Immunization History  Administered Date(s) Administered   PFIZER(Purple Top)SARS-COV-2 Vaccination 07/09/2019, 07/30/2019, 03/11/2020, 11/11/2020     Objective: Vital Signs: BP 115/70 (BP Location: Left Arm, Patient Position: Sitting, Cuff Size: Large)   Pulse (!) 57   Resp 14   Ht 5\' 3"  (1.6 m)   Wt 193  lb (87.5 kg)   LMP 07/15/2013   BMI 34.19 kg/m    Physical Exam Vitals and nursing note reviewed.  Constitutional:      Appearance: She is well-developed.  HENT:     Head: Normocephalic and atraumatic.  Eyes:     Conjunctiva/sclera: Conjunctivae normal.  Cardiovascular:     Rate and Rhythm: Normal rate and regular rhythm.     Heart sounds: Normal heart sounds.  Pulmonary:     Effort: Pulmonary effort is normal.     Breath sounds: Normal breath sounds.  Abdominal:     General: Bowel sounds are normal.     Palpations:  Abdomen is soft.  Musculoskeletal:     Cervical back: Normal range of motion.  Lymphadenopathy:     Cervical: No cervical adenopathy.  Skin:    General: Skin is warm and dry.     Capillary Refill: Capillary refill takes less than 2 seconds.  Neurological:     Mental Status: She is alert and oriented to person, place, and time.  Psychiatric:        Behavior: Behavior normal.      Musculoskeletal Exam: Cervical, thoracic and lumbar spine with good range of motion.  Shoulder joints, elbow joints, wrist joints, MCPs PIPs and DIPs with good range of motion with no synovitis.  Hip joints in good range of motion.  She had tenderness on palpation of right trochanteric bursa.  Knee joints in good range of motion without any warmth swelling or effusion.  There was no tenderness over ankles or MTPs.  CDAI Exam: CDAI Score: -- Patient Global: --; Provider Global: -- Swollen: --; Tender: -- Joint Exam 09/11/2023   No joint exam has been documented for this visit   There is currently no information documented on the homunculus. Go to the Rheumatology activity and complete the homunculus joint exam.  Investigation: No additional findings.  Imaging: No results found.  Recent Labs: Lab Results  Component Value Date   WBC 8.6 08/11/2023   HGB 12.7 08/11/2023   PLT 263 08/11/2023   NA 140 08/11/2023   K 5.1 08/11/2023   CL 105 08/11/2023   CO2 28 08/11/2023   GLUCOSE 88 08/11/2023   BUN 17 08/11/2023   CREATININE 0.82 08/11/2023   BILITOT 0.5 08/11/2023   ALKPHOS 108 01/10/2017   AST 16 08/11/2023   ALT 8 08/11/2023   PROT 6.5 08/11/2023   ALBUMIN 3.9 01/10/2017   CALCIUM 9.1 08/11/2023   GFRAA 96 10/25/2020    Speciality Comments: PLQ Eye Exam 05/07/17 WNL  Procedures:  Large Joint Inj on 09/11/2023 4:04 PM Indications: pain Details: 27 G 1.5 in needle, lateral approach  Arthrogram: No  Medications: 40 mg triamcinolone  acetonide 40 MG/ML; 1.5 mL lidocaine  1 % Aspirate: 0  mL Outcome: tolerated well, no immediate complications  Risk of infection, tendon injury, hypopigmentation, dermal atrophy were discussed. Procedure, treatment alternatives, risks and benefits explained, specific risks discussed. Consent was given by the patient. Immediately prior to procedure a time out was called to verify the correct patient, procedure, equipment, support staff and site/side marked as required. Patient was prepped and draped in the usual sterile fashion.     Allergies: Penicillins   Assessment / Plan:     Visit Diagnoses: Psoriatic arthritis (HCC)-patient denies having a flare of psoriatic arthritis.  She states her arthritis is well-controlled on methotrexate  4 tablets p.o. weekly along with folic acid .  She denies any history of dactylitis, inflammatory arthritis, plantar fasciitis,  Achilles tendinitis, sacroiliitis or uveitis.  No synovitis was noted on the examination today.  Psoriasis-she had no active psoriasis lesions.  High risk medication use - Methotrexate  4 tablets by mouth every week and folic acid  1 mg by mouth daily.  Labs obtained on August 11, 2023 CBC and CMP were normal.  She was advised to get labs in June and every 3 months to monitor for drug toxicity.  Information immunization was placed in the AVS.  She was advised to hold methotrexate  if she develops an infection and resume after the infection resolves.  Trochanteric bursitis, right hip-she had pain and discomfort over the right trochanteric bursa.  She had been experiencing nocturnal pain and pain on prolonged sitting and getting up from the chair.  She tenderness on palpation of her trochanteric bursa.  After different treatment options and side effects were discussed, right trochanteric bursa was injected with lidocaine  and Kenalog  as described above.  Patient tolerated the procedure well.  Postprocedure instructions were given.  A handout on IT band stretches was given.  I advised her to contact us  if  her symptoms persist.  Postmenopausal-January 05, 2020 DEXA scan was normal.  Will schedule DEXA scan next year.  Use of calcium rich diet vitamin D was discussed.  Need for regular exercise was discussed.  Orders: Orders Placed This Encounter  Procedures   Large Joint Inj   No orders of the defined types were placed in this encounter.  Face-to-face time spent patient was over 30 minutes.  Greater than 50% time was spent in counseling and coordination of care.  Follow-Up Instructions: Return in about 5 months (around 02/11/2024) for Psoriatic arthritis.   Nicholas Bari, MD  Note - This record has been created using Animal nutritionist.  Chart creation errors have been sought, but may not always  have been located. Such creation errors do not reflect on  the standard of medical care.

## 2023-09-09 ENCOUNTER — Other Ambulatory Visit: Payer: Self-pay | Admitting: Physician Assistant

## 2023-09-09 NOTE — Telephone Encounter (Signed)
 Last Fill: 07/25/2023 (30 day supply)  Labs: 08/11/2023 CBC and CMP are normal.   Next Visit: 09/11/2023  Last Visit: 04/03/2023  DX: Psoriatic arthritis   Current Dose per office note 04/03/2023: Methotrexate  4 tablets by mouth every week   Okay to refill Methotrexate ?

## 2023-09-11 ENCOUNTER — Encounter: Payer: Self-pay | Admitting: Rheumatology

## 2023-09-11 ENCOUNTER — Ambulatory Visit: Payer: BC Managed Care – PPO | Attending: Rheumatology | Admitting: Rheumatology

## 2023-09-11 VITALS — BP 115/70 | HR 57 | Resp 14 | Ht 63.0 in | Wt 193.0 lb

## 2023-09-11 DIAGNOSIS — Z79899 Other long term (current) drug therapy: Secondary | ICD-10-CM

## 2023-09-11 DIAGNOSIS — L409 Psoriasis, unspecified: Secondary | ICD-10-CM | POA: Diagnosis not present

## 2023-09-11 DIAGNOSIS — R202 Paresthesia of skin: Secondary | ICD-10-CM

## 2023-09-11 DIAGNOSIS — Z780 Asymptomatic menopausal state: Secondary | ICD-10-CM

## 2023-09-11 DIAGNOSIS — M7062 Trochanteric bursitis, left hip: Secondary | ICD-10-CM

## 2023-09-11 DIAGNOSIS — M7061 Trochanteric bursitis, right hip: Secondary | ICD-10-CM

## 2023-09-11 DIAGNOSIS — L4050 Arthropathic psoriasis, unspecified: Secondary | ICD-10-CM | POA: Diagnosis not present

## 2023-09-11 MED ORDER — LIDOCAINE HCL 1 % IJ SOLN
1.5000 mL | INTRAMUSCULAR | Status: AC | PRN
  Administered 2023-09-11: 1.5 mL

## 2023-09-11 MED ORDER — TRIAMCINOLONE ACETONIDE 40 MG/ML IJ SUSP
40.0000 mg | INTRAMUSCULAR | Status: AC | PRN
  Administered 2023-09-11: 40 mg via INTRA_ARTICULAR

## 2023-09-11 NOTE — Patient Instructions (Addendum)
 Standing Labs We placed an order today for your standing lab work.   Please have your standing labs drawn in June and every 3 months  Please have your labs drawn 2 weeks prior to your appointment so that the provider can discuss your lab results at your appointment, if possible.  Please note that you may see your imaging and lab results in MyChart before we have reviewed them. We will contact you once all results are reviewed. Please allow our office up to 72 hours to thoroughly review all of the results before contacting the office for clarification of your results.  WALK-IN LAB HOURS  Monday through Thursday from 8:00 am -12:30 pm and 1:00 pm-5:00 pm and Friday from 8:00 am-12:00 pm.  Patients with office visits requiring labs will be seen before walk-in labs.  You may encounter longer than normal wait times. Please allow additional time. Wait times may be shorter on  Monday and Thursday afternoons.  We do not book appointments for walk-in labs. We appreciate your patience and understanding with our staff.   Labs are drawn by [facility]. Please bring your co-pay at the time of your lab draw.  You may receive a bill from [facility] for your lab work.  Please note if you are on Hydroxychloroquine and and an order has been placed for a Hydroxychloroquine level,  you will need to have it drawn 4 hours or more after your last dose.  If you wish to have your labs drawn at another location, please call the office 24 hours in advance so we can fax the orders.  The office is located at [address], [address], [place], [place] [address]   If you have any questions regarding directions or hours of operation,  please call [phone].   As a reminder, please drink plenty of water prior to coming for your lab work. Thanks!   Vaccines You are taking a medication(s) that can suppress your immune system.  The following immunizations are recommended: Flu annually Covid-19  RSV Td/Tdap (tetanus,  diphtheria, pertussis) every 10 years Pneumonia (Prevnar 15 then Pneumovax 23 at least 1 year apart.  Alternatively, can take Prevnar 20 without needing additional dose) Shingrix: 2 doses from 4 weeks to 6 months apart  Please check with your PCP to make sure you are up to date.  If you have signs or symptoms of an infection or start antibiotics: First, call your PCP for workup of your infection. Hold your medication through the infection, until you complete your antibiotics, and until symptoms resolve if you take the following: Injectable medication (Actemra, Benlysta, Cimzia, Cosentyx, Enbrel, Humira, Kevzara, Orencia, Remicade, Simponi, Stelara, Taltz, Tremfya) Methotrexate  Leflunomide (Arava) Mycophenolate (Cellcept)  .Iliotibial Band Syndrome Rehab Ask your health care provider which exercises are safe for you. Do exercises exactly as told by your provider and adjust them as told. It's normal to feel mild stretching, pulling, tightness, or discomfort as you do these exercises. Stop right away if you feel sudden pain or your pain gets a lot worse. Do not begin these exercises until told by your provider. Stretching and range-of-motion exercises These exercises warm up your muscles and joints. They also improve the movement and flexibility of your hip and pelvis. Quadriceps stretch, prone  Lie face down (prone) on a firm surface like a bed or padded floor. Bend your left / right knee. Reach back to hold your ankle or pant leg. If you can't reach your ankle or pant leg, use a belt  looped around your foot and grab the belt instead. Gently pull your heel toward your butt. Your knee should not slide out to the side. You should feel a stretch in the front of your thigh and knee, also called the quadriceps. Hold this position for __________ seconds. Repeat __________ times. Complete this exercise __________ times a day. Iliotibial band stretch The iliotibial band is a strip of tissue that runs  along the outside of your hip down to your knee. Lie on your side with your left / right leg on top. Bend both knees and grab your left / right ankle. Stretch out your bottom arm to help you balance. Slowly bring your top knee back so your thigh goes behind your back. Slowly lower your top leg toward the floor until you feel a gentle stretch on the outside of your left / right hip and thigh. If you don't feel a stretch and your knee won't go farther, place the heel of your other foot on top of your knee and pull your knee down toward the floor with your foot. Hold this position for __________ seconds. Repeat __________ times. Complete this exercise __________ times a day. Strengthening exercises These exercises build strength and endurance in your hip and pelvis. Endurance means your muscles can keep working even when they're tired. Straight leg raises, side-lying This exercise strengthens the muscles that rotate the leg at the hip and move it away from your body. These muscles are called hip abductors. Lie on your side with your left / right leg on top. Lie so your head, shoulder, hip, and knee line up. You can bend your bottom knee to help you balance. Roll your hips slightly forward so they're stacked directly over each other. Your left / right knee should face forward. Tense the muscles in your outer thigh and hip. Lift your top leg 4-6 inches (10-15 cm) off the ground. Hold this position for __________ seconds. Slowly lower your leg back down to the starting position. Let your muscles fully relax before doing this exercise again. Repeat __________ times. Complete this exercise __________ times a day. Leg raises, prone This exercise strengthens the muscles that move the hips backward. These muscles are called hip extensors. Lie face down (prone) on your bed or a firm surface. You can put a pillow under your hips for comfort and to support your lower back. Bend your left / right knee so your  foot points straight up toward the ceiling. Keep the other leg straight and behind you. Squeeze your butt muscles. Lift your left / right thigh off the firm surface. Do not let your back arch. Tense your thigh muscle as hard as you can without having more knee pain. Hold this position for __________ seconds. Slowly lower your leg to the starting position. Allow your leg to relax all the way. Repeat __________ times. Complete this exercise __________ times a day. Hip hike  Stand sideways on a bottom step. Place your feet so that your left / right leg is on the step, and the other foot is hanging off the side. If you need support for balance, hold onto a railing or wall. Keep your knees straight and your abdomen square, meaning your hips are level. Then, lift your left / right hip up toward the ceiling. Slowly let your leg that's hanging off the step lower towards the floor. Your foot should get closer to the ground. Do not lean or bend your knees during this movement. Repeat __________  times. Complete this exercise __________ times a day. This information is not intended to replace advice given to you by your health care provider. Make sure you discuss any questions you have with your health care provider. Document Revised: 07/12/2022 Document Reviewed: 07/12/2022 Elsevier Patient Education  [address]. [place], [place], or [place]

## 2023-11-30 ENCOUNTER — Other Ambulatory Visit: Payer: Self-pay | Admitting: Physician Assistant

## 2023-12-01 NOTE — Telephone Encounter (Signed)
 Last Fill: 09/09/2023  Labs: 08/11/2023 CBC and CMP are normal.   Next Visit: 02/12/2024  Last Visit: 09/11/2023  DX: Psoriatic arthritis   Current Dose per office note 09/11/2023: Methotrexate  4 tablets by mouth every week   Patient advised she is due to update labs. Patient states she will update them this week.   Okay to refill Methotrexate ?

## 2023-12-05 ENCOUNTER — Other Ambulatory Visit: Payer: Self-pay | Admitting: *Deleted

## 2023-12-05 DIAGNOSIS — L409 Psoriasis, unspecified: Secondary | ICD-10-CM

## 2023-12-05 DIAGNOSIS — Z79899 Other long term (current) drug therapy: Secondary | ICD-10-CM

## 2023-12-05 DIAGNOSIS — L4050 Arthropathic psoriasis, unspecified: Secondary | ICD-10-CM

## 2023-12-05 LAB — COMPREHENSIVE METABOLIC PANEL WITH GFR
AG Ratio: 1.4 (calc) (ref 1.0–2.5)
ALT: 13 U/L (ref 6–29)
AST: 16 U/L (ref 10–35)
Albumin: 3.7 g/dL (ref 3.6–5.1)
Alkaline phosphatase (APISO): 89 U/L (ref 37–153)
BUN: 17 mg/dL (ref 7–25)
CO2: 29 mmol/L (ref 20–32)
Calcium: 9 mg/dL (ref 8.6–10.4)
Chloride: 103 mmol/L (ref 98–110)
Creat: 0.87 mg/dL (ref 0.50–1.05)
Globulin: 2.7 g/dL (ref 1.9–3.7)
Glucose, Bld: 81 mg/dL (ref 65–99)
Potassium: 5 mmol/L (ref 3.5–5.3)
Sodium: 139 mmol/L (ref 135–146)
Total Bilirubin: 0.6 mg/dL (ref 0.2–1.2)
Total Protein: 6.4 g/dL (ref 6.1–8.1)
eGFR: 76 mL/min/1.73m2 (ref >=60)

## 2023-12-05 LAB — CBC WITH DIFFERENTIAL/PLATELET
Absolute Lymphocytes: 2245 {cells}/uL (ref 850–3900)
Absolute Monocytes: 1051 {cells}/uL — ABNORMAL HIGH (ref 200–950)
Basophils Absolute: 93 {cells}/uL (ref 0–200)
Basophils Relative: 0.9 %
Eosinophils Absolute: 350 {cells}/uL (ref 15–500)
Eosinophils Relative: 3.4 %
HCT: 39.7 % (ref 35.0–45.0)
Hemoglobin: 13.3 g/dL (ref 11.7–15.5)
MCH: 33.8 pg — ABNORMAL HIGH (ref 27.0–33.0)
MCHC: 33.5 g/dL (ref 32.0–36.0)
MCV: 100.8 fL — ABNORMAL HIGH (ref 80.0–100.0)
MPV: 10.1 fL (ref 7.5–12.5)
Monocytes Relative: 10.2 %
Neutro Abs: 6561 {cells}/uL (ref 1500–7800)
Neutrophils Relative %: 63.7 %
Platelets: 288 Thousand/uL (ref 140–400)
RBC: 3.94 Million/uL (ref 3.80–5.10)
RDW: 15.3 % — ABNORMAL HIGH (ref 11.0–15.0)
Total Lymphocyte: 21.8 %
WBC: 10.3 Thousand/uL (ref 3.8–10.8)

## 2023-12-06 ENCOUNTER — Ambulatory Visit: Payer: Self-pay | Admitting: Rheumatology

## 2023-12-06 NOTE — Progress Notes (Signed)
 CBC and CMP are within normal limits except MCV is mildly elevated.  Patient should take folic acid  on a regular basis.

## 2023-12-22 ENCOUNTER — Other Ambulatory Visit: Payer: Self-pay | Admitting: Rheumatology

## 2024-01-29 NOTE — Progress Notes (Deleted)
 Office Visit Note  Patient: [name]             Date of Birth: 10-21-1960           MRN: [id number]             PCP: Patient, No Pcp Per Referring: No ref. provider found Visit Date: 02/12/2024 Occupation: TEACHER  Subjective:  No chief complaint on file.   History of Present Illness: [name] is a 63 y.o. female ***     Activities of Daily Living:  Patient reports morning stiffness for *** {minute/hour:19697}.   Patient {ACTIONS;DENIES/REPORTS:21021675::Denies} nocturnal pain.  Difficulty dressing/grooming: {ACTIONS;DENIES/REPORTS:21021675::Denies} Difficulty climbing stairs: {ACTIONS;DENIES/REPORTS:21021675::Denies} Difficulty getting out of chair: {ACTIONS;DENIES/REPORTS:21021675::Denies} Difficulty using hands for taps, buttons, cutlery, and/or writing: {ACTIONS;DENIES/REPORTS:21021675::Denies}  No Rheumatology ROS completed.   PMFS History:  Patient Active Problem List   Diagnosis Date Noted   Pain in both hands 08/27/2016   High risk medication use 08/27/2016   Psoriatic arthritis (HCC) 08/27/2016   History of obesity 08/27/2016   Other psoriasis 08/27/2016   Methotrexate , long term, current use 06/22/2016    Past Medical History:  Diagnosis Date   Migraines    Osteoarthritis    Psoriasis    Psoriatic arthritis (HCC)     Family History  Problem Relation Age of Onset   Cancer Mother        Lung   Heart disease Father    Healthy Son    Healthy Son    Past Surgical History:  Procedure Laterality Date   CESAREAN SECTION  1995, 1998   Social History   Tobacco Use   Smoking status: Never    Passive exposure: Never   Smokeless tobacco: Never  Vaping Use   Vaping status: Never Used  Substance Use Topics   Alcohol use: No   Drug use: No   Social History   Social History Narrative   Not on file     Immunization History  Administered Date(s) Administered   PFIZER(Purple Top)SARS-COV-2 Vaccination 07/09/2019,  07/30/2019, 03/11/2020, 11/11/2020     Objective: Vital Signs: LMP 07/15/2013    Physical Exam   Musculoskeletal Exam: ***  CDAI Exam: CDAI Score: -- Patient Global: --; Provider Global: -- Swollen: --; Tender: -- Joint Exam 02/12/2024   No joint exam has been documented for this visit   There is currently no information documented on the homunculus. Go to the Rheumatology activity and complete the homunculus joint exam.  Investigation: No additional findings.  Imaging: No results found.  Recent Labs: Lab Results  Component Value Date   WBC 10.3 12/05/2023   HGB 13.3 12/05/2023   PLT 288 12/05/2023   NA 139 12/05/2023   K 5.0 12/05/2023   CL 103 12/05/2023   CO2 29 12/05/2023   GLUCOSE 81 12/05/2023   BUN 17 12/05/2023   CREATININE 0.87 12/05/2023   BILITOT 0.6 12/05/2023   ALKPHOS 108 01/10/2017   AST 16 12/05/2023   ALT 13 12/05/2023   PROT 6.4 12/05/2023   ALBUMIN 3.9 01/10/2017   CALCIUM 9.0 12/05/2023   GFRAA 96 10/25/2020    Speciality Comments: PLQ Eye Exam 05/07/17 WNL  Procedures:  No procedures performed Allergies: Penicillins   Assessment / Plan:     Visit Diagnoses: No diagnosis found.  Orders: No orders of the defined types were placed in this encounter.  No orders of the defined types were placed in this encounter.   Face-to-face time spent with patient was ***  minutes. Greater than 50% of time was spent in counseling and coordination of care.  Follow-Up Instructions: No follow-ups on file.   Daved JAYSON Gavel, CMA  Note - This record has been created using Animal nutritionist.  Chart creation errors have been sought, but may not always  have been located. Such creation errors do not reflect on  the standard of medical care.

## 2024-02-01 ENCOUNTER — Other Ambulatory Visit: Payer: Self-pay | Admitting: Physician Assistant

## 2024-02-02 NOTE — Telephone Encounter (Signed)
 Last Fill: 11/25/2023  Next Visit: 02/12/2024  Last Visit: 09/11/2023  Dx: Psoriatic arthritis   Current Dose per office note on 09/11/2023: folic acid  1 mg by mouth daily   Okay to refill Folic Acid ?

## 2024-02-12 ENCOUNTER — Ambulatory Visit: Admitting: Rheumatology

## 2024-02-12 DIAGNOSIS — Z79899 Other long term (current) drug therapy: Secondary | ICD-10-CM

## 2024-02-12 DIAGNOSIS — M7061 Trochanteric bursitis, right hip: Secondary | ICD-10-CM

## 2024-02-12 DIAGNOSIS — Z780 Asymptomatic menopausal state: Secondary | ICD-10-CM

## 2024-02-12 DIAGNOSIS — L4050 Arthropathic psoriasis, unspecified: Secondary | ICD-10-CM

## 2024-02-12 DIAGNOSIS — L409 Psoriasis, unspecified: Secondary | ICD-10-CM

## 2024-03-11 DIAGNOSIS — Z01419 Encounter for gynecological examination (general) (routine) without abnormal findings: Secondary | ICD-10-CM | POA: Diagnosis not present

## 2024-03-27 ENCOUNTER — Other Ambulatory Visit: Payer: Self-pay | Admitting: Rheumatology

## 2024-03-29 NOTE — Telephone Encounter (Signed)
 Last Fill: 12/01/2023  Labs: 12/05/2023 CBC and CMP are within normal limits except MCV is mildly elevated. Patient should take folic acid  on a regular basis.   Next Visit: 05/03/2024  Last Visit: 09/11/2023  DX: Psoriatic arthritis   Current Dose per office note on 09/11/2023: Methotrexate  4 tablets by mouth every week   Attempted to contact patient and left message on machine to advise patient she is due to update labs. Standing orders are in place.   Okay to refill Methotrexate ?

## 2024-04-05 ENCOUNTER — Other Ambulatory Visit: Payer: Self-pay | Admitting: *Deleted

## 2024-04-05 DIAGNOSIS — L409 Psoriasis, unspecified: Secondary | ICD-10-CM | POA: Diagnosis not present

## 2024-04-05 DIAGNOSIS — L4050 Arthropathic psoriasis, unspecified: Secondary | ICD-10-CM | POA: Diagnosis not present

## 2024-04-05 DIAGNOSIS — Z1231 Encounter for screening mammogram for malignant neoplasm of breast: Secondary | ICD-10-CM | POA: Diagnosis not present

## 2024-04-05 DIAGNOSIS — Z79899 Other long term (current) drug therapy: Secondary | ICD-10-CM | POA: Diagnosis not present

## 2024-04-05 DIAGNOSIS — G43019 Migraine without aura, intractable, without status migrainosus: Secondary | ICD-10-CM | POA: Diagnosis not present

## 2024-04-06 ENCOUNTER — Ambulatory Visit: Payer: Self-pay | Admitting: Rheumatology

## 2024-04-06 LAB — COMPREHENSIVE METABOLIC PANEL WITH GFR
AG Ratio: 1.2 (calc) (ref 1.0–2.5)
ALT: 9 U/L (ref 6–29)
AST: 16 U/L (ref 10–35)
Albumin: 3.6 g/dL (ref 3.6–5.1)
Alkaline phosphatase (APISO): 88 U/L (ref 37–153)
BUN: 18 mg/dL (ref 7–25)
CO2: 28 mmol/L (ref 20–32)
Calcium: 8.9 mg/dL (ref 8.6–10.4)
Chloride: 104 mmol/L (ref 98–110)
Creat: 0.97 mg/dL (ref 0.50–1.05)
Globulin: 2.9 g/dL (ref 1.9–3.7)
Glucose, Bld: 99 mg/dL (ref 65–99)
Potassium: 4.4 mmol/L (ref 3.5–5.3)
Sodium: 139 mmol/L (ref 135–146)
Total Bilirubin: 0.5 mg/dL (ref 0.2–1.2)
Total Protein: 6.5 g/dL (ref 6.1–8.1)
eGFR: 66 mL/min/1.73m2 (ref >=60)

## 2024-04-06 LAB — CBC WITH DIFFERENTIAL/PLATELET
Absolute Lymphocytes: 2202 {cells}/uL (ref 850–3900)
Absolute Monocytes: 461 {cells}/uL (ref 200–950)
Basophils Absolute: 70 {cells}/uL (ref 0–200)
Basophils Relative: 1.1 %
Eosinophils Absolute: 198 {cells}/uL (ref 15–500)
Eosinophils Relative: 3.1 %
HCT: 41.6 % (ref 35.9–46.0)
Hemoglobin: 13.7 g/dL (ref 11.7–15.5)
MCH: 31.9 pg (ref 27.0–33.0)
MCHC: 32.9 g/dL (ref 31.6–35.4)
MCV: 96.7 fL (ref 81.4–101.7)
MPV: 10.3 fL (ref 7.5–12.5)
Monocytes Relative: 7.2 %
Neutro Abs: 3469 {cells}/uL (ref 1500–7800)
Neutrophils Relative %: 54.2 %
Platelets: 300 Thousand/uL (ref 140–400)
RBC: 4.3 Million/uL (ref 3.80–5.10)
RDW: 12.6 % (ref 11.0–15.0)
Total Lymphocyte: 34.4 %
WBC: 6.4 Thousand/uL (ref 3.8–10.8)

## 2024-04-19 NOTE — Progress Notes (Deleted)
 Office Visit Note  Patient: [name]             Date of Birth: 02-17-1961           MRN: [id number]             PCP: Patient, No Pcp Per Referring: No ref. provider found Visit Date: 05/03/2024 Occupation: TEACHER  Subjective:  No chief complaint on file.   History of Present Illness: [name] is a 63 y.o. female ***     Activities of Daily Living:  Patient reports morning stiffness for *** {minute/hour:19697}.   Patient {ACTIONS;DENIES/REPORTS:21021675::Denies} nocturnal pain.  Difficulty dressing/grooming: {ACTIONS;DENIES/REPORTS:21021675::Denies} Difficulty climbing stairs: {ACTIONS;DENIES/REPORTS:21021675::Denies} Difficulty getting out of chair: {ACTIONS;DENIES/REPORTS:21021675::Denies} Difficulty using hands for taps, buttons, cutlery, and/or writing: {ACTIONS;DENIES/REPORTS:21021675::Denies}  No Rheumatology ROS completed.   PMFS History:  Patient Active Problem List   Diagnosis Date Noted   Pain in both hands 08/27/2016   High risk medication use 08/27/2016   Psoriatic arthritis (HCC) 08/27/2016   History of obesity 08/27/2016   Other psoriasis 08/27/2016   Methotrexate , long term, current use 06/22/2016    Past Medical History:  Diagnosis Date   Migraines    Osteoarthritis    Psoriasis    Psoriatic arthritis (HCC)     Family History  Problem Relation Age of Onset   Cancer Mother        Lung   Heart disease Father    Healthy Son    Healthy Son    Past Surgical History:  Procedure Laterality Date   CESAREAN SECTION  1995, 1998   Social History   Tobacco Use   Smoking status: Never    Passive exposure: Never   Smokeless tobacco: Never  Vaping Use   Vaping status: Never Used  Substance Use Topics   Alcohol use: No   Drug use: No   Social History   Social History Narrative   Not on file     Immunization History  Administered Date(s) Administered   PFIZER(Purple Top)SARS-COV-2 Vaccination 07/09/2019,  07/30/2019, 03/11/2020, 11/11/2020     Objective: Vital Signs: LMP 07/15/2013    Physical Exam   Musculoskeletal Exam: ***  CDAI Exam: CDAI Score: -- Patient Global: --; Provider Global: -- Swollen: --; Tender: -- Joint Exam 05/03/2024   No joint exam has been documented for this visit   There is currently no information documented on the homunculus. Go to the Rheumatology activity and complete the homunculus joint exam.  Investigation: No additional findings.  Imaging: No results found.  Recent Labs: Lab Results  Component Value Date   WBC 6.4 04/05/2024   HGB 13.7 04/05/2024   PLT 300 04/05/2024   NA 139 04/05/2024   K 4.4 04/05/2024   CL 104 04/05/2024   CO2 28 04/05/2024   GLUCOSE 99 04/05/2024   BUN 18 04/05/2024   CREATININE 0.97 04/05/2024   BILITOT 0.5 04/05/2024   ALKPHOS 108 01/10/2017   AST 16 04/05/2024   ALT 9 04/05/2024   PROT 6.5 04/05/2024   ALBUMIN 3.9 01/10/2017   CALCIUM 8.9 04/05/2024   GFRAA 96 10/25/2020    Speciality Comments: PLQ Eye Exam 05/07/17 WNL  Procedures:  No procedures performed Allergies: Penicillins   Assessment / Plan:     Visit Diagnoses: Psoriatic arthritis (HCC)  Psoriasis  High risk medication use  Trochanteric bursitis, right hip  Trochanteric bursitis, left hip  Left hand paresthesia  Postmenopausal  Orders: No orders of the defined types were placed in  this encounter.  No orders of the defined types were placed in this encounter.   Face-to-face time spent with patient was *** minutes. Greater than 50% of time was spent in counseling and coordination of care.  Follow-Up Instructions: No follow-ups on file.   Waddell CHRISTELLA Craze, PA-C  Note - This record has been created using Dragon software.  Chart creation errors have been sought, but may not always  have been located. Such creation errors do not reflect on  the standard of medical care.

## 2024-04-23 ENCOUNTER — Other Ambulatory Visit: Payer: Self-pay | Admitting: Physician Assistant

## 2024-04-23 NOTE — Telephone Encounter (Signed)
 Last Fill: 03/29/2024  Labs: 04/05/2024 normal  Next Visit: 05/03/2024  Last Visit: 09/11/2023  DX: Psoriatic arthritis (HCC)   Current Dose per office note 09/11/2023: Methotrexate  4 tablets by mouth every week   Okay to refill Methotrexate ?

## 2024-05-03 ENCOUNTER — Ambulatory Visit: Payer: Self-pay | Admitting: Physician Assistant

## 2024-05-03 DIAGNOSIS — M7062 Trochanteric bursitis, left hip: Secondary | ICD-10-CM

## 2024-05-03 DIAGNOSIS — Z780 Asymptomatic menopausal state: Secondary | ICD-10-CM

## 2024-05-03 DIAGNOSIS — Z79899 Other long term (current) drug therapy: Secondary | ICD-10-CM

## 2024-05-03 DIAGNOSIS — M7061 Trochanteric bursitis, right hip: Secondary | ICD-10-CM

## 2024-05-03 DIAGNOSIS — R202 Paresthesia of skin: Secondary | ICD-10-CM

## 2024-05-03 DIAGNOSIS — L4050 Arthropathic psoriasis, unspecified: Secondary | ICD-10-CM

## 2024-05-03 DIAGNOSIS — L409 Psoriasis, unspecified: Secondary | ICD-10-CM

## 2024-08-11 ENCOUNTER — Ambulatory Visit: Admitting: Physician Assistant
